# Patient Record
Sex: Female | Born: 1995 | Race: White | Hispanic: No | Marital: Single | Smoking: Current every day smoker
Health system: Southern US, Community
[De-identification: ages and names within clinical notes are randomized; demographics above are authoritative.]

## PROBLEM LIST (undated history)

## (undated) ENCOUNTER — Inpatient Hospital Stay: Payer: Self-pay

## (undated) ENCOUNTER — Inpatient Hospital Stay (HOSPITAL_COMMUNITY): Payer: Self-pay

## (undated) DIAGNOSIS — F419 Anxiety disorder, unspecified: Secondary | ICD-10-CM

## (undated) DIAGNOSIS — F32A Depression, unspecified: Secondary | ICD-10-CM

## (undated) DIAGNOSIS — K219 Gastro-esophageal reflux disease without esophagitis: Secondary | ICD-10-CM

## (undated) DIAGNOSIS — B1920 Unspecified viral hepatitis C without hepatic coma: Secondary | ICD-10-CM

## (undated) DIAGNOSIS — J45909 Unspecified asthma, uncomplicated: Secondary | ICD-10-CM

## (undated) DIAGNOSIS — F329 Major depressive disorder, single episode, unspecified: Secondary | ICD-10-CM

## (undated) HISTORY — PX: TONSILLECTOMY: SUR1361

## (undated) HISTORY — PX: TONSILECTOMY, ADENOIDECTOMY, BILATERAL MYRINGOTOMY AND TUBES: SHX2538

---
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
## 2016-04-06 DIAGNOSIS — F329 Major depressive disorder, single episode, unspecified: Secondary | ICD-10-CM | POA: Insufficient documentation

## 2016-04-06 DIAGNOSIS — N760 Acute vaginitis: Secondary | ICD-10-CM | POA: Insufficient documentation

## 2016-04-06 DIAGNOSIS — Z91040 Latex allergy status: Secondary | ICD-10-CM | POA: Insufficient documentation

## 2016-04-06 DIAGNOSIS — F17210 Nicotine dependence, cigarettes, uncomplicated: Secondary | ICD-10-CM | POA: Diagnosis not present

## 2016-04-06 DIAGNOSIS — R102 Pelvic and perineal pain: Secondary | ICD-10-CM | POA: Diagnosis present

## 2016-04-07 ENCOUNTER — Emergency Department
Admission: EM | Admit: 2016-04-07 | Discharge: 2016-04-07 | Disposition: A | Discharge status: Home / Self Care | Payer: Medicaid Other | Attending: Emergency Medicine | Admitting: Emergency Medicine

## 2016-04-07 ENCOUNTER — Emergency Department: Payer: Medicaid Other

## 2016-04-07 ENCOUNTER — Encounter: Payer: Self-pay | Admitting: Emergency Medicine

## 2016-04-07 DIAGNOSIS — N760 Acute vaginitis: Secondary | ICD-10-CM

## 2016-04-07 DIAGNOSIS — B9689 Other specified bacterial agents as the cause of diseases classified elsewhere: Secondary | ICD-10-CM

## 2016-04-07 DIAGNOSIS — R102 Pelvic and perineal pain: Secondary | ICD-10-CM

## 2016-04-07 HISTORY — DX: Anxiety disorder, unspecified: F41.9

## 2016-04-07 HISTORY — DX: Major depressive disorder, single episode, unspecified: F32.9

## 2016-04-07 HISTORY — DX: Depression, unspecified: F32.A

## 2016-04-07 LAB — CHLAMYDIA/NGC RT PCR (ARMC ONLY)
Chlamydia Tr: NOT DETECTED
N gonorrhoeae: NOT DETECTED

## 2016-04-07 LAB — URINALYSIS COMPLETE WITH MICROSCOPIC (ARMC ONLY)
Bacteria, UA: NONE SEEN
Bilirubin Urine: NEGATIVE
Glucose, UA: NEGATIVE mg/dL
Ketones, ur: NEGATIVE mg/dL
Leukocytes, UA: NEGATIVE
Nitrite: NEGATIVE
Protein, ur: NEGATIVE mg/dL
Specific Gravity, Urine: 1.02 (ref 1.005–1.030)
pH: 6 (ref 5.0–8.0)

## 2016-04-07 LAB — PREGNANCY, URINE: PREG TEST UR: NEGATIVE

## 2016-04-07 LAB — WET PREP, GENITAL
Sperm: NONE SEEN
Trich, Wet Prep: NONE SEEN
Yeast Wet Prep HPF POC: NONE SEEN

## 2016-04-07 MED ORDER — METRONIDAZOLE 500 MG PO TABS: 500.0000 mg | ORAL_TABLET | Freq: Two times a day (BID) | ORAL | Status: DC

## 2016-04-07 NOTE — Discharge Instructions (Signed)
Your exam was reassuring today.  Your symptoms may be the result of a common, non-sexually-transmitted infection called bacterial vaginosis.  We recommend she take the full course of prescribed medication and take over-the-counter Tylenol and ibuprofen according to the label instructions.  Please follow up either with your regular doctor or with the GYN specialist listed in this paperwork.   Bacterial Vaginosis Bacterial vaginosis is a vaginal infection that occurs when the normal balance of bacteria in the vagina is disrupted. It results from an overgrowth of certain bacteria. This is the most common vaginal infection in women of childbearing age. Treatment is important to prevent complications, especially in pregnant women, as it can cause a premature delivery. CAUSES  Bacterial vaginosis is caused by an increase in harmful bacteria that are normally present in smaller amounts in the vagina. Several different kinds of bacteria can cause bacterial vaginosis. However, the reason that the condition develops is not fully understood. RISK FACTORS Certain activities or behaviors can put you at an increased risk of developing bacterial vaginosis, including:  Having a new sex partner or multiple sex partners.  Douching.  Using an intrauterine device (IUD) for contraception. Women do not get bacterial vaginosis from toilet seats, bedding, swimming pools, or contact with objects around them. SIGNS AND SYMPTOMS  Some women with bacterial vaginosis have no signs or symptoms. Common symptoms include:  Grey vaginal discharge.  A fishlike odor with discharge, especially after sexual intercourse.  Itching or burning of the vagina and vulva.  Burning or pain with urination. DIAGNOSIS  Your health care provider will take a medical history and examine the vagina for signs of bacterial vaginosis. A sample of vaginal fluid may be taken. Your health care provider will look at this sample under a microscope to  check for bacteria and abnormal cells. A vaginal pH test may also be done.  TREATMENT  Bacterial vaginosis may be treated with antibiotic medicines. These may be given in the form of a pill or a vaginal cream. A second round of antibiotics may be prescribed if the condition comes back after treatment. Because bacterial vaginosis increases your risk for sexually transmitted diseases, getting treated can help reduce your risk for chlamydia, gonorrhea, HIV, and herpes. HOME CARE INSTRUCTIONS   Only take over-the-counter or prescription medicines as directed by your health care provider.  If antibiotic medicine was prescribed, take it as directed. Make sure you finish it even if you start to feel better.  Tell all sexual partners that you have a vaginal infection. They should see their health care provider and be treated if they have problems, such as a mild rash or itching.  During treatment, it is important that you follow these instructions:  Avoid sexual activity or use condoms correctly.  Do not douche.  Avoid alcohol as directed by your health care provider.  Avoid breastfeeding as directed by your health care provider. SEEK MEDICAL CARE IF:   Your symptoms are not improving after 3 days of treatment.  You have increased discharge or pain.  You have a fever. MAKE SURE YOU:   Understand these instructions.  Will watch your condition.  Will get help right away if you are not doing well or get worse. FOR MORE INFORMATION  Centers for Disease Control and Prevention, Division of STD Prevention: SolutionApps.co.zawww.cdc.gov/std American Sexual Health Association (ASHA): www.ashastd.org    This information is not intended to replace advice given to you by your health care provider. Make sure you discuss any questions  you have with your health care provider.   Document Released: 10/01/2005 Document Revised: 10/22/2014 Document Reviewed: 05/13/2013 Elsevier Interactive Patient Education 2016  [employer]  Pelvic Pain, Female Female pelvic pain can be caused by many different things and start from a variety of places. Pelvic pain refers to pain that is located in the lower half of the abdomen and between your hips. The pain may occur over a short period of time (acute) or may be reoccurring (chronic). The cause of pelvic pain may be related to disorders affecting the female reproductive organs (gynecologic), but it may also be related to the bladder, kidney stones, an intestinal complication, or muscle or skeletal problems. Getting help right away for pelvic pain is important, especially if there has been severe, sharp, or a sudden onset of unusual pain. It is also important to get help right away because some types of pelvic pain can be life threatening.  CAUSES  Below are only some of the causes of pelvic pain. The causes of pelvic pain can be in one of several categories.   Gynecologic.  Pelvic inflammatory disease.  Sexually transmitted infection.  Ovarian cyst or a twisted ovarian ligament (ovarian torsion).  Uterine lining that grows outside the uterus (endometriosis).  Fibroids, cysts, or tumors.  Ovulation.  Pregnancy.  Pregnancy that occurs outside the uterus (ectopic pregnancy).  Miscarriage.  Labor.  Abruption of the placenta or ruptured uterus.  Infection.  Uterine infection (endometritis).  Bladder infection.  Diverticulitis.  Miscarriage related to a uterine infection (septic abortion).  Bladder.  Inflammation of the bladder (cystitis).  Kidney stone(s).  Gastrointestinal.  Constipation.  Diverticulitis.  Neurologic.  Trauma.  Feeling pelvic pain because of mental or emotional causes (psychosomatic).  Cancers of the bowel or pelvis. EVALUATION  Your caregiver will want to take a careful history of your concerns. This includes recent changes in your health, a careful gynecologic history of your periods (menses), and a sexual  history. Obtaining your family history and medical history is also important. Your caregiver may suggest a pelvic exam. A pelvic exam will help identify the location and severity of the pain. It also helps in the evaluation of which organ system may be involved. In order to identify the cause of the pelvic pain and be properly treated, your caregiver may order tests. These tests may include:   A pregnancy test.  Pelvic ultrasonography.  An X-ray exam of the abdomen.  A urinalysis or evaluation of vaginal discharge.  Blood tests. HOME CARE INSTRUCTIONS   Only take over-the-counter or prescription medicines for pain, discomfort, or fever as directed by your caregiver.   Rest as directed by your caregiver.   Eat a balanced diet.   Drink enough fluids to make your urine clear or pale yellow, or as directed.   Avoid sexual intercourse if it causes pain.   Apply warm or cold compresses to the lower abdomen depending on which one helps the pain.   Avoid stressful situations.   Keep a journal of your pelvic pain. Write down when it started, where the pain is located, and if there are things that seem to be associated with the pain, such as food or your menstrual cycle.  Follow up with your caregiver as directed.  SEEK MEDICAL CARE IF:  Your medicine does not help your pain.  You have abnormal vaginal discharge. SEEK IMMEDIATE MEDICAL CARE IF:   You have heavy bleeding from the vagina.   Your pelvic pain increases.  You feel light-headed or faint.   You have chills.   You have pain with urination or blood in your urine.   You have uncontrolled diarrhea or vomiting.   You have a fever or persistent symptoms for more than 3 days.  You have a fever and your symptoms suddenly get worse.   You are being physically or sexually abused.   This information is not intended to replace advice given to you by your health care provider. Make sure you discuss any  questions you have with your health care provider.   Document Released: 08/28/2004 Document Revised: 06/22/2015 Document Reviewed: 01/21/2012 Elsevier Interactive Patient Education [employer]

## 2016-04-07 NOTE — ED Provider Notes (Signed)
[facility]  ____________________________________________  Time seen: Approximately 2:32 AM  I have reviewed the triage vital signs and the nursing notes.   HISTORY  Chief Complaint Pelvic Pain    HPI Shirley Black is a 20 y.o. female with a past medical history that includes at least 2 prior episodes of PID and who has an IUD in place by her family doctor who presents for evaluation of gradual onset of centralized lower pelvic pain that is both sharp and aching in character, waxes and wanes from mild to moderate in intensity, is worse with exertion and movement, and is gotten worse over the last few days.  It was severe earlier today which prompted her to come to the emergency department tonight.  She denies fever/chills, chest pain, shortness of breath, nausea, vomiting, diarrhea, dysuria.  She is sexually active currently and does not use condoms.   Past Medical History  Diagnosis Date  . Anxiety   . Depression     Patient Active Problem List   Diagnosis Date Noted  . EAR PAIN, LEFT 01/11/2010  . MENORRHAGIA 01/11/2010  . 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 Tonsillectomy      Current Outpatient Rx  Name  Route  Sig  Dispense  Refill  . metroNIDAZOLE (FLAGYL) 500 MG tablet   Oral   Take 1 tablet (500 mg total) by mouth 2 (two) times daily.   14 tablet   0     Allergies Latex  History reviewed. No pertinent family history.  Social History Social History  Substance Use Topics  . Smoking status: Current Every Day Smoker -- 1.00 packs/day    Types: Cigarettes  . Smokeless tobacco: Never Used  . Alcohol Use: No    Review of Systems Constitutional: No fever/chills Eyes: No visual changes. ENT: No sore throat. Cardiovascular: Denies chest pain. Respiratory: Denies shortness of breath. Gastrointestinal: +pelvic pain.  No  nausea, no vomiting.  No diarrhea.  No constipation. Genitourinary: Negative for dysuria.   Musculoskeletal: Negative for back pain. Skin: Negative for rash. Neurological: Negative for headaches, focal weakness or numbness.  10-point ROS otherwise negative.  ____________________________________________   PHYSICAL EXAM:  VITAL SIGNS: ED Triage Vitals  Enc Vitals Group     BP 04/07/16 0001 139/81 mmHg     Pulse Rate 04/07/16 0001 104     Resp 04/07/16 0001 20     Temp 04/07/16 0001 98.3 F (36.8 C)     Temp Source 04/07/16 0001 Oral     SpO2 04/07/16 0001 98 %     Weight 04/07/16 0001 180 lb (81.647 kg)     Height 04/07/16 0001 5\' 5"  (1.651 m)     Head Cir --      Peak Flow --      Pain Score 04/07/16 0002 8     Pain Loc --      Pain Edu? --      Excl. in GC? --     Constitutional: Alert and oriented. Well appearing and in no acute distress. Eyes: Conjunctivae are normal. PERRL. EOMI. Head: Atraumatic. Nose: No congestion/rhinnorhea. Mouth/Throat: Mucous membranes are moist.  Oropharynx non-erythematous. Neck: No stridor.  No meningeal signs.   Cardiovascular: Normal rate, regular rhythm. Good peripheral circulation. Grossly normal heart sounds.   Respiratory: Normal respiratory effort.  No retractions. Lungs CTAB. Gastrointestinal: Soft and nontender. No distention.  Genitourinary:  No external lesions.  Thick yellowish-white discharge in vault.  IUD strings visible and appear appropriately placed.  Mild left adnexal tenderness on bimanual exam. Musculoskeletal: No lower extremity tenderness nor edema. No gross deformities of extremities. Neurologic:  Normal speech and language. No gross focal neurologic deficits are appreciated.  Skin:  Skin is warm, dry and intact. No rash noted. Psychiatric: Mood and affect are normal. Speech and behavior are normal.  ____________________________________________   LABS (all labs ordered are listed, but only abnormal results are  displayed)  Labs Reviewed  WET PREP, GENITAL - Abnormal; Notable for the following:    Clue Cells Wet Prep HPF POC PRESENT (*)    WBC, Wet Prep HPF POC MODERATE (*)    All other components within normal limits  URINALYSIS COMPLETEWITH MICROSCOPIC ([facility] ONLY) - Abnormal; Notable for the following:    Color, Urine YELLOW (*)    APPearance HAZY (*)    Hgb urine dipstick 1+ (*)    Squamous Epithelial / LPF 6-30 (*)    All other components within normal limits  CHLAMYDIA/NGC RT PCR ([facility] ONLY)  PREGNANCY, URINE  POC URINE PREG, ED   ____________________________________________  EKG  None ____________________________________________  RADIOLOGY   [place] Transvaginal Non-ob  04/07/2016  CLINICAL DATA:  20 year old female with intermittent pelvic pain. Evaluate for IUD positioning. EXAM: TRANSABDOMINAL AND TRANSVAGINAL ULTRASOUND OF PELVIS TECHNIQUE: Both transabdominal and transvaginal ultrasound examinations of the pelvis were performed. Transabdominal technique was performed for global imaging of the pelvis including uterus, ovaries, adnexal regions, and pelvic cul-de-sac. It was necessary to proceed with endovaginal exam following the transabdominal exam to visualize the endometrium and the ovaries. COMPARISON:  None FINDINGS: Uterus Measurements: 5.7 x 2.9 x 4.0 cm. No fibroids or other mass visualized. Endometrium Thickness: 4 mm. No focal abnormality visualized. An intrauterine device is noted which appears slightly lower than the uterine fundus . On the transvaginal images the arms of the arms of the IUD are not visualized in uterine fundus. Right ovary Measurements: 4.2 x 2.1 x 2.4 cm. There is a 1.8 x 1.5 x 1.8 cm hypoechoic lesion in the right ovary, likely a complex/hemorrhagic cyst versus a corpus luteum. Left ovary Measurements: 3.9 x 1.8 x 2.1 cm. Normal appearance/no adnexal mass. Other findings Small free fluid within the pelvis. IMPRESSION: Intrauterine device appears  slightly lower than uterine fundus. Clinical correlation is recommended. Probable right ovarian corpus luteum. Electronically Signed   By: Elgie CollardArash  Radparvar M.D.   On: 04/07/2016 05:20   [place] Pelvis Complete  04/07/2016  CLINICAL DATA:  20 year old female with intermittent pelvic pain. Evaluate for IUD positioning. EXAM: TRANSABDOMINAL AND TRANSVAGINAL ULTRASOUND OF PELVIS TECHNIQUE: Both transabdominal and transvaginal ultrasound examinations of the pelvis were performed. Transabdominal technique was performed for global imaging of the pelvis including uterus, ovaries, adnexal regions, and pelvic cul-de-sac. It was necessary to proceed with endovaginal exam following the transabdominal exam to visualize the endometrium and the ovaries. COMPARISON:  None FINDINGS: Uterus Measurements: 5.7 x 2.9 x 4.0 cm. No fibroids or other mass visualized. Endometrium Thickness: 4 mm. No focal abnormality visualized. An intrauterine device is noted which appears slightly lower than the uterine fundus . On the transvaginal images the arms of the arms of the IUD are not visualized in uterine fundus. Right ovary Measurements: 4.2 x 2.1 x 2.4 cm. There is a 1.8 x 1.5 x 1.8 cm hypoechoic lesion in the right ovary, likely a complex/hemorrhagic cyst versus a corpus luteum. Left ovary Measurements: 3.9  x 1.8 x 2.1 cm. Normal appearance/no adnexal mass. Other findings Small free fluid within the pelvis. IMPRESSION: Intrauterine device appears slightly lower than uterine fundus. Clinical correlation is recommended. Probable right ovarian corpus luteum. Electronically Signed   By: Elgie Collard M.D.   On: 04/07/2016 05:20    ____________________________________________   PROCEDURES  Procedure(s) performed:   Procedures   ____________________________________________   INITIAL IMPRESSION / ASSESSMENT AND PLAN / ED COURSE  Pertinent labs & imaging results that were available during my care of the patient were reviewed by me  and considered in my medical decision making (see chart for details).  Evaluation unremarkable except for Bacterial vaginosis.  The ultrasound question the position of the IUD, but on pelvic exam correlates clinically and her bili.  I am providing metronidazole and recommending close outpatient follow-up.  The patient has been sleeping comfortably since the ultrasound was obtained.  I gave my usual customary return precautions.   ____________________________________________  FINAL CLINICAL IMPRESSION(S) / ED DIAGNOSES  Final diagnoses:  Bacterial vaginosis  Pelvic pain in female     MEDICATIONS GIVEN DURING THIS VISIT:  Medications - No data to display   NEW OUTPATIENT MEDICATIONS STARTED DURING THIS VISIT:  Discharge Medication List as of 04/07/2016  5:32 AM    START taking these medications   Details  metroNIDAZOLE (FLAGYL) 500 MG tablet Take 1 tablet (500 mg total) by mouth 2 (two) times daily., Starting 04/07/2016, Until Discontinued, Print          Note:  This document was prepared using Dragon voice recognition software and may include unintentional dictation errors.   Loleta Rose, MD 04/07/16 [phone]

## 2016-04-07 NOTE — ED Notes (Signed)
Pt. States she has a three year IUD that will expire at the end of this year.  Pt. States she has had PID two times, last time was last year.  Pt. States intermittent pain for the past week, worse today.

## 2016-04-07 NOTE — ED Notes (Signed)
Pt presents to ED with sharp pelvic pain. Pt states she has a IUD in place that is getting ready to expire and has been experiencing intermittent pelvic pain for over a week. Pain worsened today and makes it uncomfortable for her to sit down. Denies vaginal discharge.

## 2016-09-17 ENCOUNTER — Ambulatory Visit: Payer: Medicaid Other

## 2016-10-04 ENCOUNTER — Encounter: Payer: Self-pay | Admitting: *Deleted

## 2016-10-04 ENCOUNTER — Ambulatory Visit
Admission: RE | Admit: 2016-10-04 | Discharge: 2016-10-04 | Disposition: A | Discharge status: Home / Self Care | Payer: Medicaid Other | Source: Ambulatory Visit | Attending: Maternal and Fetal Medicine | Admitting: Maternal and Fetal Medicine

## 2016-10-04 VITALS — BP 122/65 | HR 90 | Temp 98.1°F | Wt 240.0 lb

## 2016-10-04 DIAGNOSIS — J4530 Mild persistent asthma, uncomplicated: Secondary | ICD-10-CM

## 2016-10-04 DIAGNOSIS — B1920 Unspecified viral hepatitis C without hepatic coma: Secondary | ICD-10-CM | POA: Diagnosis not present

## 2016-10-04 HISTORY — DX: Unspecified viral hepatitis C without hepatic coma: B19.20

## 2016-10-04 HISTORY — DX: Unspecified asthma, uncomplicated: J45.909

## 2016-10-04 NOTE — Consult Note (Signed)
[facility] Consultation   Chief Complaint: New diagnosis of Hepatitis C   HPI: Shirley Black is a 20 y.o. G1P0 at 2929w2d by  who presents in consultation from  for a new diagnosis of Hepatitis C. The patient had testing on initial prenatal labs. She had a +ab test and a confirmatory PCR was positive with a viral load of 778,000 IU/ml and genotype 3. She denies injection or snorting drug use but reports that she has had three home tattoo sessions, one in 2015, one early 2017, and one mid-2017. She thinks these were not clean needles.   Past Medical History: Patient  has a past medical history of Anxiety; Asthma; Depression; and Hepatitis C.  She does use her inhaler 3 times per week and had significant asthma as a child requiring nebulizer therapy. She denies hospitalization history.  Past Surgical History: She  has a past surgical history that includes Tonsillectomy and Tonsilectomy, adenoidectomy, bilateral myringotomy and tubes.  Obstetric History:  OB History    Gravida Para Term Preterm AB Living   1         0   SAB TAB Ectopic Multiple Live Births                   Medications: She takes albuterol and lexapro  Allergies: Patient is allergic to latex.  Social History: Patient  reports that she has been smoking Cigarettes.  She has been smoking about 1.00 pack per day. She has never used smokeless tobacco. She reports that she does not drink alcohol or use drugs. She reports recently decreasing to 4-5 cigs/day  Family History: family history is not on file.  Review of Systems A full 12 point review of systems was negative or as noted in the History of Present Illness.  Physical Exam: BP 122/65   Pulse 90   Temp 98.1 F (36.7 C)   Wt 240 lb (108.9 kg)   BMI 39.94 kg/m  Advice only  Asessement: 1. Hepatitis C in pregnancy  We discussed the risk of Hepatitis C in general including liver failure (cirrhosis) and cancer. We discussed the blood-borne  nature of the disease and the need for avoidance of sharing needles. We also discussed that the disease is now treatable with medications that can essentially lead to clinical cure. I advised that we do not recommend the use of these medications during pregnancy but that it is important that she establish care with a hepatologist who can assess her candidacy for medical therapy and develop a plan for treatment following this pregnancy and ideally before she considers another pregnancy. I recommend checking a PT/PTT to evaluate liver function now and would re-check her AST/ALT each trimester.  We also discussed the risk of fetal transmission of approximately 5%. We discussed that this is likely a function of viral load but that the precise risks related to her viral load are unknown. In general pregnancy is not managed differently due to the diagnosis of HCV. We recommend avoiding fetal scalp electrodes if possible and minimizing the duration of ruptured membranes by avoiding early amniotomy. Cesarean delivery is not indicated. Breast feeding is acceptable but she should avoid cracked and bleeding nipples.  We discussed the importance of hepatology follow-up for the infant and that the infant should be followed until age 19 months to rule out congenital infection.  2. Asthma, given the frequent use of her rescue inhaler, I recommend adding a daily steroid inhaler to decrease the  risk of asthma flares. We also recommend checking peak flows with a peak flow meter if she doesn't already do this. She may need a peak flow meter prescription as well.   Total time spent with the patient was 40 minutes with greater than 50% spent in counseling and coordination of care. We appreciate this interesting consult and will be happy to be involved in the ongoing care of Shirley Black in anyway her obstetricians desire.  Artemio AlyBrenna Osamu Olguin, MD [facility]aternal[facility]Fetal Medicine [facility]

## 2016-11-08 ENCOUNTER — Other Ambulatory Visit: Payer: Self-pay | Admitting: Obstetrics and Gynecology

## 2016-11-08 DIAGNOSIS — Z3402 Encounter for supervision of normal first pregnancy, second trimester: Secondary | ICD-10-CM

## 2016-11-08 DIAGNOSIS — Z3689 Encounter for other specified antenatal screening: Secondary | ICD-10-CM

## 2016-11-09 ENCOUNTER — Ambulatory Visit (HOSPITAL_COMMUNITY): Payer: Medicaid Other

## 2017-01-13 ENCOUNTER — Encounter: Payer: Self-pay | Admitting: Emergency Medicine

## 2017-01-13 ENCOUNTER — Emergency Department
Admission: EM | Admit: 2017-01-13 | Discharge: 2017-01-13 | Disposition: A | Discharge status: Home / Self Care | Payer: Medicaid Other | Attending: Emergency Medicine | Admitting: Emergency Medicine

## 2017-01-13 ENCOUNTER — Emergency Department: Payer: Medicaid Other

## 2017-01-13 DIAGNOSIS — O99333 Smoking (tobacco) complicating pregnancy, third trimester: Secondary | ICD-10-CM | POA: Diagnosis not present

## 2017-01-13 DIAGNOSIS — Z3A29 29 weeks gestation of pregnancy: Secondary | ICD-10-CM | POA: Insufficient documentation

## 2017-01-13 DIAGNOSIS — J4521 Mild intermittent asthma with (acute) exacerbation: Secondary | ICD-10-CM

## 2017-01-13 DIAGNOSIS — F17210 Nicotine dependence, cigarettes, uncomplicated: Secondary | ICD-10-CM | POA: Diagnosis not present

## 2017-01-13 DIAGNOSIS — O99513 Diseases of the respiratory system complicating pregnancy, third trimester: Secondary | ICD-10-CM | POA: Insufficient documentation

## 2017-01-13 MED ORDER — IPRATROPIUM-ALBUTEROL 0.5-2.5 (3) MG/3ML IN SOLN
3.0000 mL | Freq: Once | RESPIRATORY_TRACT | Status: AC
  Administered 2017-01-13: 3 mL via RESPIRATORY_TRACT
  Filled 2017-01-13: qty 3

## 2017-01-13 MED ORDER — SODIUM CHLORIDE 0.9 % IV BOLUS (SEPSIS)
1000.0000 mL | Freq: Once | INTRAVENOUS | Status: AC
  Administered 2017-01-13: 1000 mL via INTRAVENOUS

## 2017-01-13 MED ORDER — ONDANSETRON HCL 4 MG/2ML IJ SOLN
4.0000 mg | Freq: Once | INTRAMUSCULAR | Status: AC
  Administered 2017-01-13: 4 mg via INTRAVENOUS
  Filled 2017-01-13: qty 2

## 2017-01-13 MED ORDER — ALBUTEROL SULFATE HFA 108 (90 BASE) MCG/ACT IN AERS: 2.0000 | INHALATION_SPRAY | Freq: Four times a day (QID) | RESPIRATORY_TRACT | 2 refills | Status: DC | PRN

## 2017-01-13 MED ORDER — METHYLPREDNISOLONE SODIUM SUCC 125 MG IJ SOLR
125.0000 mg | Freq: Once | INTRAMUSCULAR | Status: AC
  Administered 2017-01-13: 125 mg via INTRAVENOUS
  Filled 2017-01-13: qty 2

## 2017-01-13 MED ORDER — PREDNISONE 20 MG PO TABS: 60.0000 mg | ORAL_TABLET | Freq: Every day | ORAL | 0 refills | Status: AC

## 2017-01-13 NOTE — ED Provider Notes (Signed)
[facility] Emergency Department Provider Note  ____________________________________________  Time seen: Approximately 8:03 PM  I have reviewed the triage vital signs and the nursing notes.   HISTORY  Chief Complaint Cough   HPI Shirley Black is a 21 y.o. female with h/o asthma, smoking, and G1P0 currently at [redacted] weeks GA who presents for evaluation of SOB, wheezing, and cough. Patient reports that she has had cough, SOB, and wheezing for 3 weeks. Has finished a course of tamiflu, one of amoxicillin, and one of augmentin, and one course of steroids. She has had no fever or body aches however continues to have a severe cough, post-tussive emesis, and wheezing. Has been using her inhalers at home with minimal relief. No complications during pregnancy, no contractions, no vaginal bleeding, no water loss, good fetal movement. No diarrhea or CP.   Past Medical History:  Diagnosis Date  . Anxiety   . Asthma   . Depression   . Hepatitis C     Patient Active Problem List   Diagnosis Date Noted  . EAR PAIN, LEFT 01/11/2010  . MENORRHAGIA 01/11/2010  . 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 TONSILECTOMY, ADENOIDECTOMY, BILATERAL MYRINGOTOMY AND TUBES    . TONSILLECTOMY      Prior to Admission medications   Medication Sig Start Date End Date Taking? Authorizing Provider  albuterol (PROVENTIL HFA;VENTOLIN HFA) 108 (90 Base) MCG/ACT inhaler Inhale 2 puffs into the lungs every 6 (six) hours as needed for wheezing or shortness of breath. 01/13/17   Nita Sickle, MD  predniSONE (DELTASONE) 20 MG tablet Take 3 tablets (60 mg total) by mouth daily. 01/13/17 01/17/17  Nita Sickle, MD  Prenatal Vit-Fe Fumarate-FA (MULTIVITAMIN-PRENATAL) 27-0.8 MG TABS tablet Take 1 tablet by mouth daily at 12 noon.    Historical Provider, MD    Allergies Latex  No family history on file.  Social  History Social History  Substance Use Topics  . Smoking status: Current Every Day Smoker    Packs/day: 1.00    Types: Cigarettes  . Smokeless tobacco: Never Used  . Alcohol use No    Review of Systems  Constitutional: Negative for fever. Eyes: Negative for visual changes. ENT: Negative for sore throat. Neck: No neck pain  Cardiovascular: Negative for chest pain. Respiratory: + shortness of breath, cough, wheezing Gastrointestinal: Negative for abdominal pain, vomiting or diarrhea. Genitourinary: Negative for dysuria. Musculoskeletal: Negative for back pain. Skin: Negative for rash. Neurological: Negative for headaches, weakness or numbness. Psych: No SI or HI  ____________________________________________   PHYSICAL EXAM:  VITAL SIGNS: ED Triage Vitals  Enc Vitals Group     BP 01/13/17 1802 (!) 144/87     Pulse Rate 01/13/17 1802 (!) 124     Resp 01/13/17 1802 20     Temp 01/13/17 1802 98.2 F (36.8 C)     Temp Source 01/13/17 1802 Oral     SpO2 01/13/17 1802 98 %     Weight --      Height 01/13/17 1800  (1.651 m)     Head Circumference --      Peak Flow --      Pain Score 01/13/17 1800 5     Pain Loc --      Pain Edu? --      Excl. in GC? --     Constitutional: Alert and oriented. Well appearing and in no apparent distress. HEENT:  Head: Normocephalic and atraumatic.         Eyes: Conjunctivae are normal. Sclera is non-icteric. EOMI. PERRL      Mouth/Throat: Mucous membranes are moist.       Neck: Supple with no signs of meningismus. Cardiovascular: Tachycardic with regular rhythm. No murmurs, gallops, or rubs. 2+ symmetrical distal pulses are present in all extremities. No JVD. Respiratory: Normal respiratory effort. slightly decreased breath sounds and faint expiratory wheezes Gastrointestinal: Soft, non tender, and non distended with positive bowel sounds. No rebound or guarding. Genitourinary: No CVA tenderness. Musculoskeletal: Nontender with  normal range of motion in all extremities. No edema, cyanosis, or erythema of extremities. Neurologic: Normal speech and language. Face is symmetric. Moving all extremities. No gross focal neurologic deficits are appreciated. Skin: Skin is warm, dry and intact. No rash noted. Psychiatric: Mood and affect are normal. Speech and behavior are normal.  ____________________________________________   LABS (all labs ordered are listed, but only abnormal results are displayed)  Labs Reviewed - No data to display ____________________________________________  EKG  ED ECG REPORT I, Nita Sickle, the attending physician, personally viewed and interpreted this ECG.  Sinus tachycardia, rate of 1:15, normal intervals, normal axis, no ST elevations or depressions, flattening T waves in lead 3. No prior for comparison ____________________________________________  RADIOLOGY  CXR: Negative  ____________________________________________   PROCEDURES  Procedure(s) performed:yes Procedures   BEDSIDE US showing IUP with FHR 142  Critical Care performed:  None ____________________________________________   INITIAL IMPRESSION / ASSESSMENT AND PLAN / ED COURSE   21 y.o. female with h/o asthma, smoking, and G1P0 currently at [redacted] weeks GA who presents for evaluation of SOB, wheezing, cough, and post-tussive emesis. Patient has normal work of breathing, normal sats, she has slightly decreased air movement with faint expiratory wheezes. She is tachycardic and afebrile and has had several episodes of posttussive emesis. We'll give IV fluids, duoneb, zofran, Solu-Medrol, we'll get a chest x-ray to rule out pneumonia.  Clinical Course as of Jan 13 2153  Wynelle Link Jan 13, 2017  2151 Patient's cough has markedly improved after 2 DuoNeb labs. Wheezing has resolved and patient is moving great air. Chest x-ray with no evidence of pneumonia. Good fetal activity per patient. Heart rate has gone down with IV fluids.  She is now vomiting and tolerating by mouth. We'll discharge home on prednisone, albuterol, and close follow-up with primary care doctor.  [CV]    Clinical Course User Index [CV] Nita Sickle, MD    Pertinent labs & imaging results that were available during my care of the patient were reviewed by me and considered in my medical decision making (see chart for details).    ____________________________________________   FINAL CLINICAL IMPRESSION(S) / ED DIAGNOSES  Final diagnoses:  Mild intermittent asthma with exacerbation      NEW MEDICATIONS STARTED DURING THIS VISIT:  New Prescriptions   ALBUTEROL (PROVENTIL HFA;VENTOLIN HFA) 108 (90 BASE) MCG/ACT INHALER    Inhale 2 puffs into the lungs every 6 (six) hours as needed for wheezing or shortness of breath.   PREDNISONE (DELTASONE) 20 MG TABLET    Take 3 tablets (60 mg total) by mouth daily.     Note:  This document was prepared using Dragon voice recognition software and may include unintentional dictation errors.    Nita Sickle, MD 01/13/17 2154

## 2017-01-13 NOTE — ED Triage Notes (Signed)
Pt reports she was dx with flu, bronchitis and strep in the past 3 weeks. Has taken prescribed medications with no relief. Pt reports pain to upper body from amount of coughing. Pt is [redacted] weeks pregnant.

## 2017-02-14 ENCOUNTER — Observation Stay
Admission: EM | Admit: 2017-02-14 | Discharge: 2017-02-14 | Disposition: A | Discharge status: Home / Self Care | Payer: Medicaid Other | Attending: Obstetrics and Gynecology | Admitting: Obstetrics and Gynecology

## 2017-02-14 DIAGNOSIS — O98813 Other maternal infectious and parasitic diseases complicating pregnancy, third trimester: Secondary | ICD-10-CM | POA: Insufficient documentation

## 2017-02-14 DIAGNOSIS — Z3A33 33 weeks gestation of pregnancy: Secondary | ICD-10-CM | POA: Diagnosis not present

## 2017-02-14 DIAGNOSIS — O23593 Infection of other part of genital tract in pregnancy, third trimester: Secondary | ICD-10-CM

## 2017-02-14 DIAGNOSIS — O99333 Smoking (tobacco) complicating pregnancy, third trimester: Secondary | ICD-10-CM | POA: Insufficient documentation

## 2017-02-14 DIAGNOSIS — R102 Pelvic and perineal pain unspecified side: Secondary | ICD-10-CM | POA: Diagnosis present

## 2017-02-14 DIAGNOSIS — N760 Acute vaginitis: Secondary | ICD-10-CM

## 2017-02-14 DIAGNOSIS — F17210 Nicotine dependence, cigarettes, uncomplicated: Secondary | ICD-10-CM | POA: Diagnosis not present

## 2017-02-14 DIAGNOSIS — Z79899 Other long term (current) drug therapy: Secondary | ICD-10-CM | POA: Insufficient documentation

## 2017-02-14 DIAGNOSIS — O368130 Decreased fetal movements, third trimester, not applicable or unspecified: Principal | ICD-10-CM | POA: Insufficient documentation

## 2017-02-14 DIAGNOSIS — Z7951 Long term (current) use of inhaled steroids: Secondary | ICD-10-CM | POA: Diagnosis not present

## 2017-02-14 DIAGNOSIS — O26893 Other specified pregnancy related conditions, third trimester: Secondary | ICD-10-CM | POA: Diagnosis present

## 2017-02-14 LAB — WET PREP, GENITAL
CLUE CELLS WET PREP: NONE SEEN
Sperm: NONE SEEN
TRICH WET PREP: NONE SEEN

## 2017-02-14 LAB — URINALYSIS, ROUTINE W REFLEX MICROSCOPIC
Bilirubin Urine: NEGATIVE
GLUCOSE, UA: NEGATIVE mg/dL
Hgb urine dipstick: NEGATIVE
Ketones, ur: 5 mg/dL — AB
LEUKOCYTES UA: NEGATIVE
NITRITE: NEGATIVE
PROTEIN: NEGATIVE mg/dL
Specific Gravity, Urine: 1.017 (ref 1.005–1.030)
pH: 6 (ref 5.0–8.0)

## 2017-02-14 LAB — CHLAMYDIA/NGC RT PCR (ARMC ONLY)
Chlamydia Tr: NOT DETECTED
N gonorrhoeae: NOT DETECTED

## 2017-02-14 LAB — FETAL FIBRONECTIN: Fetal Fibronectin: NEGATIVE

## 2017-02-14 MED ORDER — CALCIUM CARBONATE ANTACID 500 MG PO CHEW: 2.0000 | CHEWABLE_TABLET | ORAL | Status: DC | PRN

## 2017-02-14 MED ORDER — FLUCONAZOLE 50 MG PO TABS
150.0000 mg | ORAL_TABLET | Freq: Every day | ORAL | Status: DC
  Administered 2017-02-14: 150 mg via ORAL
  Filled 2017-02-14: qty 3

## 2017-02-14 MED ORDER — PRENATAL MULTIVITAMIN CH: 1.0000 | ORAL_TABLET | Freq: Every day | ORAL | Status: DC

## 2017-02-14 MED ORDER — ZOLPIDEM TARTRATE 5 MG PO TABS: 5.0000 mg | ORAL_TABLET | Freq: Every evening | ORAL | Status: DC | PRN

## 2017-02-14 MED ORDER — ACETAMINOPHEN 325 MG PO TABS: 650.0000 mg | ORAL_TABLET | ORAL | Status: DC | PRN

## 2017-02-14 MED ORDER — DOCUSATE SODIUM 100 MG PO CAPS: 100.0000 mg | ORAL_CAPSULE | Freq: Every day | ORAL | Status: DC

## 2017-02-14 NOTE — OB Triage Provider Note (Signed)
TRIAGE NOTE to rule out Preterm Labor   History of Present Illness: Shirley Black is a 21 y.o. G1P0 at [redacted]w[redacted]d presenting to triage for preterm pain.  Pelvic pressure and pain today starting at 0630 this morning. Decreased fetal movement. No LOF, no VB. No recent intercourse.   Patient Active Problem List   Diagnosis Date Noted  . EAR PAIN, LEFT 01/11/2010  . MENORRHAGIA 01/11/2010  . ACNE VULGARIS, CYSTIC, PUSTULAR 01/11/2010    Past Medical History:  Diagnosis Date  . Anxiety   . Asthma   . Depression   . Hepatitis C     Past Surgical History:  Procedure Laterality Date  . TONSILECTOMY, ADENOIDECTOMY, BILATERAL MYRINGOTOMY AND TUBES    . TONSILLECTOMY      OB History  Gravida Para Term Preterm AB Living  1         0  SAB TAB Ectopic Multiple Live Births               # Outcome Date GA Lbr Len/2nd Weight Sex Delivery Anes PTL Lv  1 Current               Social History   Social History  . Marital status: Single    Spouse name: N/A  . Number of children: N/A  . Years of education: N/A   Social History Main Topics  . Smoking status: Current Every Day Smoker    Packs/day: 0.50    Types: Cigarettes  . Smokeless tobacco: Never Used  . Alcohol use No  . Drug use: No  . Sexual activity: Yes    Birth control/ protection: IUD   Other Topics Concern  . None   Social History Narrative  . None    History reviewed. No pertinent family history.  Allergies  Allergen Reactions  . Latex Hives  . Adhesive [Tape] Rash    Prescriptions Prior to Admission  Medication Sig Dispense Refill Last Dose  . albuterol (PROVENTIL HFA;VENTOLIN HFA) 108 (90 Base) MCG/ACT inhaler Inhale 2 puffs into the lungs every 6 (six) hours as needed for wheezing or shortness of breath. 1 Inhaler 2 Past Week at Unknown time  . Prenatal Vit-Fe Fumarate-FA (MULTIVITAMIN-PRENATAL) 27-0.8 MG TABS tablet Take 1 tablet by mouth daily at 12 noon.   Past Month at Unknown time     Review of Systems - See HPI for OB specific ROS.   Vitals:  BP 122/73 (BP Location: Right Arm)   Pulse (!) 109   Temp 98.5 F (36.9 C) (Oral)   Resp 18   Ht 5\' 5"  (1.651 m)   Wt 240 lb (108.9 kg)   BMI 39.94 kg/m  Physical Examination: CONSTITUTIONAL: Well-developed, well-nourished female in no acute distress.  HENT:  Normocephalic, atraumatic EYES: Conjunctivae and EOM are normal. No scleral icterus.  NECK: Normal range of motion, supple, SKIN: Skin is warm and dry. No rash noted. Not diaphoretic. No erythema. No pallor. NEUROLGIC: Alert and oriented to person, place, and time. No gross cranial nerve deficit noted. PSYCHIATRIC: Normal mood and affect. Normal behavior. Normal judgment and thought content. CARDIOVASCULAR: Normal heart rate noted, regular rhythm RESPIRATORY: Effort and breath sounds normal, no problems with respiration noted ABDOMEN: Soft, nontender, nondistended, gravid.  Cervix: closed/thick and high Membranes:intact Fetal Monitoring:Baseline: 125 bpm, Variability: Good {> 6 bpm), Accelerations: Non-reactive but appropriate for gestational age and Decelerations: Absent Tocometer: Flat  Labs:  Results for orders placed or performed during the hospital encounter of 02/14/17 (from  the past 24 hour(s))  Wet prep, genital   Collection Time: 02/14/17  1:27 PM  Result Value Ref Range   Yeast Wet Prep HPF POC PRESENT (A) NONE SEEN   Trich, Wet Prep NONE SEEN NONE SEEN   Clue Cells Wet Prep HPF POC NONE SEEN NONE SEEN   WBC, Wet Prep HPF POC FEW (A) NONE SEEN   Sperm NONE SEEN   Urinalysis, Routine w reflex microscopic   Collection Time: 02/14/17  1:27 PM  Result Value Ref Range   Color, Urine YELLOW (A) YELLOW   APPearance HAZY (A) CLEAR   Specific Gravity, Urine 1.017 1.005 - 1.030   pH 6.0 5.0 - 8.0   Glucose, UA NEGATIVE NEGATIVE mg/dL   Hgb urine dipstick NEGATIVE NEGATIVE   Bilirubin Urine NEGATIVE NEGATIVE   Ketones, ur 5 (A) NEGATIVE mg/dL    Protein, ur NEGATIVE NEGATIVE mg/dL   Nitrite NEGATIVE NEGATIVE   Leukocytes, UA NEGATIVE NEGATIVE    Imaging Studies: No results found.   Assessment and Plan: Patient Active Problem List   Diagnosis Date Noted  . EAR PAIN, LEFT 01/11/2010  . MENORRHAGIA 01/11/2010  . ACNE VULGARIS, CYSTIC, PUSTULAR 01/11/2010    1. Yeast noted on wet mount. Diflucan given. Pain now resolved. Precautions given. 2.   Cline CoolsBethany E Shawnte Demarest, MD, MPH

## 2017-02-14 NOTE — OB Triage Note (Signed)
Pt G1P0 1841w1d complains of decreased fetal movement and abdominal pain/back pain. Pt states she had not felt baby move since 0600 this morning, but felt movement at 1030. Pt states back pain is in lower back and is constant pain that she rates 7/10 since 0600 this morning. Pt states abdominal pain comes and goes near her bellybutton. Pt rates pain 7/10 and states that has been happening since 0600. VSS. Monitors applied and assessing.

## 2017-02-14 NOTE — Final Progress Note (Signed)
Pain resolved, yeast infection treated. See Triage note.

## 2017-02-14 NOTE — Discharge Summary (Signed)
Final progress note completed for this triage patient. No discharge summary required.

## 2017-03-25 ENCOUNTER — Inpatient Hospital Stay
Admission: EM | Admit: 2017-03-25 | Discharge: 2017-03-25 | Disposition: A | Discharge status: Home / Self Care | Payer: Medicaid Other | Attending: Obstetrics and Gynecology | Admitting: Obstetrics and Gynecology

## 2017-03-25 ENCOUNTER — Encounter: Payer: Self-pay | Admitting: *Deleted

## 2017-03-25 ENCOUNTER — Other Ambulatory Visit: Payer: Self-pay | Admitting: Obstetrics and Gynecology

## 2017-03-25 DIAGNOSIS — O99333 Smoking (tobacco) complicating pregnancy, third trimester: Secondary | ICD-10-CM | POA: Diagnosis not present

## 2017-03-25 DIAGNOSIS — F17210 Nicotine dependence, cigarettes, uncomplicated: Secondary | ICD-10-CM | POA: Insufficient documentation

## 2017-03-25 DIAGNOSIS — Z3A38 38 weeks gestation of pregnancy: Secondary | ICD-10-CM | POA: Diagnosis not present

## 2017-03-25 DIAGNOSIS — O471 False labor at or after 37 completed weeks of gestation: Secondary | ICD-10-CM | POA: Diagnosis not present

## 2017-03-25 DIAGNOSIS — Z91040 Latex allergy status: Secondary | ICD-10-CM | POA: Diagnosis not present

## 2017-03-25 DIAGNOSIS — R1030 Lower abdominal pain, unspecified: Secondary | ICD-10-CM | POA: Diagnosis present

## 2017-03-25 DIAGNOSIS — O4700 False labor before 37 completed weeks of gestation, unspecified trimester: Secondary | ICD-10-CM

## 2017-03-25 NOTE — Progress Notes (Signed)
Shirley Black is a 21 y.o. female. She is at 3354w5d gestation. No LMP recorded. Patient is pregnant. Estimated Date of Delivery: 04/03/17 bu US   [facility]   Chief complaint:Here for lower abd cramping today .  Location:lower abd cramping Onset/timing: Last few hours  Duration:lasting for several hours  Quality: slight cramping Severity:mild  Aggravating or alleviating conditions:none Associated signs/symptoms:none Context:none  S: Resting comfortably. no CTX, no VB.no LOF,  Active fetal movement. +  Maternal Medical History:   Past Medical History:  Diagnosis Date  . Anxiety   . Asthma   . Depression   . Hepatitis C     Past Surgical History:  Procedure Laterality Date  . TONSILECTOMY, ADENOIDECTOMY, BILATERAL MYRINGOTOMY AND TUBES    . TONSILLECTOMY      Allergies  Allergen Reactions  . Latex Hives  . Adhesive [Tape] Rash    Prior to Admission medications   Medication Sig Start Date End Date Taking? Authorizing Provider  albuterol (PROVENTIL HFA;VENTOLIN HFA) 108 (90 Base) MCG/ACT inhaler Inhale 2 puffs into the lungs every 6 (six) hours as needed for wheezing or shortness of breath. 01/13/17   Nita SickleVeronese, [place], MD  Prenatal Vit-Fe Fumarate-FA (MULTIVITAMIN-PRENATAL) 27-0.8 MG TABS tablet Take 1 tablet by mouth daily at 12 noon.    [provider]     Social History: She  reports that she has been smoking Cigarettes.  She has been smoking about 0.50 packs per day. She has never used smokeless tobacco. She reports that she does not drink alcohol or use drugs.  Family History: family history is not on file.  no history of gyn cancers  Review of Systems: A full review of systems was performed and negative except as noted in the HPI.     O: See BP in nursing notes Constitutional: NAD, AAOx3  HE/ENT: extraocular movements grossly intact, moist mucous membranes CV: RRR PULM: nl respiratory effort, CTABL    Abd:  gravid, non-tender, non-distended, soft      Ext: Non-tender, Nonedmeatous   Psych: mood appropriate, speech normal Pelvic:closed and long  NST: Reactive  Baseline: 140 Variability: moderate Accelerations present x >2 Decelerations absent Time 20mins    A/P: 21 y.o. 654w5d here for antenatal surveillance for Th PTL at 38 5/7 weeks.   Labor: not present.   Fetal Wellbeing: Reassuring Cat 1 tracing.  Reactive NST   D/c home stable, precautions reviewed, follow-up as scheduled.   ----- Ranae Plumberhelsea Ward, MD Attending Obstetrician and Gynecologist [facility], Department of OB/GYN [facility]

## 2017-03-25 NOTE — OB Triage Note (Signed)
Recvd to OBS 3 with c/o leaking fluid.  Changed to gown and to bed.  EFM applied.  Oriented to room and questions answered.  Plan of care discussed.  Verbalized understanding and agrees with plan.

## 2017-03-25 NOTE — Discharge Summary (Signed)
Obstetric Discharge Summary   Patient ID: Shirley Black MRN: [id number] DOB/AGE: 20/11/1995 20 y.o.   Date of Admission: 03/25/2017  Date of Discharge: 03/25/17  Admitting Diagnosis: Observation at 9368w5d  Secondary Diagnosis: abd cramping  Mode of Delivery: N/A     Discharge Diagnosis: Term pregnancy with abd cramping   Intrapartum Procedures:N/A   Post partum procedures: N/A  Complications:Labor is ruled out.    Brief Hospital Course  Shirley Black is a G1P0 who had abd cramping today and was evaluated and observed in [facility] for labort.  Cx was closed and pt was deemed stable for discharge to home.      Labs: No flowsheet data found.  Physical exam:  Blood pressure 117/67, pulse 93, temperature 97.9 F (36.6 C), temperature source Oral, resp. rate 16. General: alert and no distress Heart:S1S2, RRR, NO M/R/G. Lungs: CTA bilat,  WUJ:WJXBJYAbd:Gravid Extremities: No evidence of DVT seen on physical exam. No lower extremity edema.  Discharge Instructions: Per After Visit Summary. Activity: Advance as tolerated.   Diet: Regular Medications:  [facility]  Postpartum contraception:  Not addressed   Discharged Condition: Stable   Discharged to: Home   Barbaraann RondoCaron W Hug[place], [place] 03/25/2017

## 2017-03-25 NOTE — Progress Notes (Signed)
Discharge instructions given and explained.  Verbalized understanding.  Signed copy on chart.  

## 2017-03-29 ENCOUNTER — Inpatient Hospital Stay (HOSPITAL_COMMUNITY)
Admission: AD | Admit: 2017-03-29 | Discharge: 2017-03-29 | Disposition: A | Discharge status: Home / Self Care | Payer: Medicaid Other | Source: Ambulatory Visit | Attending: Obstetrics and Gynecology | Admitting: Obstetrics and Gynecology

## 2017-03-29 ENCOUNTER — Encounter (HOSPITAL_COMMUNITY): Payer: Self-pay

## 2017-03-29 DIAGNOSIS — N898 Other specified noninflammatory disorders of vagina: Secondary | ICD-10-CM

## 2017-03-29 DIAGNOSIS — Z3483 Encounter for supervision of other normal pregnancy, third trimester: Secondary | ICD-10-CM | POA: Insufficient documentation

## 2017-03-29 DIAGNOSIS — O471 False labor at or after 37 completed weeks of gestation: Secondary | ICD-10-CM

## 2017-03-29 DIAGNOSIS — O26893 Other specified pregnancy related conditions, third trimester: Secondary | ICD-10-CM

## 2017-03-29 LAB — AMNISURE RUPTURE OF MEMBRANE (ROM) NOT AT ARMC: AMNISURE: NEGATIVE

## 2017-03-29 LAB — POCT FERN TEST

## 2017-03-29 NOTE — Progress Notes (Addendum)
G1P0 @ [redacted] wksga. Presents to triage for r/o SROM clear fluid around 1630 while shopping today. OB office visit today and told cervix closed and bag not broken. +FM.   1839: EFM applied.   1840: SVE: 0.5/thick Fern test ordered and done.   Fern test negative  1853: provider notified. Report status of pt given. Orders received to do amnisure.   1900: amnisure done.   1909: relinquished care over to RN [place]

## 2017-03-29 NOTE — MAU Note (Signed)
Went to the hosp Monday, had some "fluid coming out", was told she was closed and her water wasn't broke.  Was at dr's office today, was measuring large.  Has fluid coming out now, and is having bad back pain. Clear fluid started about 1630

## 2017-03-29 NOTE — Discharge Instructions (Signed)

## 2017-04-08 ENCOUNTER — Other Ambulatory Visit: Payer: Self-pay | Admitting: Obstetrics and Gynecology

## 2017-04-08 DIAGNOSIS — Z113 Encounter for screening for infections with a predominantly sexual mode of transmission: Secondary | ICD-10-CM

## 2017-04-13 ENCOUNTER — Inpatient Hospital Stay
Admission: EM | Admit: 2017-04-13 | Discharge: 2017-04-17 | DRG: 765 | Disposition: A | Discharge status: Home / Self Care | Payer: Medicaid Other | Attending: Obstetrics & Gynecology | Admitting: Obstetrics & Gynecology

## 2017-04-13 DIAGNOSIS — F32A Depression, unspecified: Secondary | ICD-10-CM | POA: Diagnosis present

## 2017-04-13 DIAGNOSIS — B182 Chronic viral hepatitis C: Secondary | ICD-10-CM

## 2017-04-13 DIAGNOSIS — O134 Gestational [pregnancy-induced] hypertension without significant proteinuria, complicating childbirth: Secondary | ICD-10-CM | POA: Diagnosis present

## 2017-04-13 DIAGNOSIS — Z3A41 41 weeks gestation of pregnancy: Secondary | ICD-10-CM | POA: Diagnosis not present

## 2017-04-13 DIAGNOSIS — O99824 Streptococcus B carrier state complicating childbirth: Secondary | ICD-10-CM | POA: Diagnosis present

## 2017-04-13 DIAGNOSIS — F17210 Nicotine dependence, cigarettes, uncomplicated: Secondary | ICD-10-CM | POA: Diagnosis present

## 2017-04-13 DIAGNOSIS — Z91040 Latex allergy status: Secondary | ICD-10-CM | POA: Diagnosis not present

## 2017-04-13 DIAGNOSIS — O9952 Diseases of the respiratory system complicating childbirth: Secondary | ICD-10-CM | POA: Diagnosis present

## 2017-04-13 DIAGNOSIS — F329 Major depressive disorder, single episode, unspecified: Secondary | ICD-10-CM | POA: Diagnosis present

## 2017-04-13 DIAGNOSIS — D62 Acute posthemorrhagic anemia: Secondary | ICD-10-CM | POA: Diagnosis not present

## 2017-04-13 DIAGNOSIS — J45909 Unspecified asthma, uncomplicated: Secondary | ICD-10-CM | POA: Diagnosis present

## 2017-04-13 DIAGNOSIS — O99344 Other mental disorders complicating childbirth: Secondary | ICD-10-CM | POA: Diagnosis present

## 2017-04-13 DIAGNOSIS — O0993 Supervision of high risk pregnancy, unspecified, third trimester: Secondary | ICD-10-CM

## 2017-04-13 DIAGNOSIS — O9081 Anemia of the puerperium: Secondary | ICD-10-CM | POA: Diagnosis not present

## 2017-04-13 DIAGNOSIS — O9842 Viral hepatitis complicating childbirth: Secondary | ICD-10-CM | POA: Diagnosis present

## 2017-04-13 DIAGNOSIS — O99334 Smoking (tobacco) complicating childbirth: Secondary | ICD-10-CM | POA: Diagnosis present

## 2017-04-13 DIAGNOSIS — B951 Streptococcus, group B, as the cause of diseases classified elsewhere: Secondary | ICD-10-CM | POA: Diagnosis present

## 2017-04-13 DIAGNOSIS — O99519 Diseases of the respiratory system complicating pregnancy, unspecified trimester: Secondary | ICD-10-CM

## 2017-04-13 DIAGNOSIS — O99214 Obesity complicating childbirth: Secondary | ICD-10-CM | POA: Diagnosis present

## 2017-04-13 DIAGNOSIS — Z8759 Personal history of other complications of pregnancy, childbirth and the puerperium: Secondary | ICD-10-CM | POA: Diagnosis present

## 2017-04-13 DIAGNOSIS — Z68.41 Body mass index (BMI) pediatric, 5th percentile to less than 85th percentile for age: Secondary | ICD-10-CM

## 2017-04-13 DIAGNOSIS — O09899 Supervision of other high risk pregnancies, unspecified trimester: Secondary | ICD-10-CM

## 2017-04-13 DIAGNOSIS — O99210 Obesity complicating pregnancy, unspecified trimester: Secondary | ICD-10-CM | POA: Diagnosis present

## 2017-04-13 DIAGNOSIS — B1920 Unspecified viral hepatitis C without hepatic coma: Secondary | ICD-10-CM | POA: Diagnosis present

## 2017-04-13 DIAGNOSIS — O99340 Other mental disorders complicating pregnancy, unspecified trimester: Secondary | ICD-10-CM | POA: Diagnosis present

## 2017-04-13 DIAGNOSIS — O139 Gestational [pregnancy-induced] hypertension without significant proteinuria, unspecified trimester: Secondary | ICD-10-CM | POA: Diagnosis present

## 2017-04-13 LAB — COMPREHENSIVE METABOLIC PANEL
ALT: 36 U/L (ref 14–54)
ANION GAP: 8 (ref 5–15)
AST: 32 U/L (ref 15–41)
Albumin: 3.2 g/dL — ABNORMAL LOW (ref 3.5–5.0)
Alkaline Phosphatase: 230 U/L — ABNORMAL HIGH (ref 38–126)
BUN: 7 mg/dL (ref 6–20)
CHLORIDE: 106 mmol/L (ref 101–111)
CO2: 22 mmol/L (ref 22–32)
Calcium: 9.1 mg/dL (ref 8.9–10.3)
Creatinine, Ser: 0.49 mg/dL (ref 0.44–1.00)
GFR calc non Af Amer: >60 mL/min (ref >60)
Glucose, Bld: 80 mg/dL (ref 65–99)
Potassium: 3.9 mmol/L (ref 3.5–5.1)
SODIUM: 136 mmol/L (ref 135–145)
Total Bilirubin: 0.4 mg/dL (ref 0.3–1.2)
Total Protein: 7.4 g/dL (ref 6.5–8.1)

## 2017-04-13 LAB — CBC
HCT: 34.4 % — ABNORMAL LOW (ref 35.0–47.0)
HEMOGLOBIN: 11.6 g/dL — AB (ref 12.0–16.0)
MCH: 29.4 pg (ref 26.0–34.0)
MCHC: 33.7 g/dL (ref 32.0–36.0)
MCV: 87.3 fL (ref 80.0–100.0)
PLATELETS: 244 10*3/uL (ref 150–440)
RBC: 3.94 MIL/uL (ref 3.80–5.20)
RDW: 14.4 % (ref 11.5–14.5)
WBC: 15.8 10*3/uL — AB (ref 3.6–11.0)

## 2017-04-13 LAB — PROTEIN / CREATININE RATIO, URINE
Creatinine, Urine: 94 mg/dL
PROTEIN CREATININE RATIO: 0.11 mg/mg{creat} (ref 0.00–0.15)
TOTAL PROTEIN, URINE: 10 mg/dL

## 2017-04-13 LAB — TYPE AND SCREEN
ABO/RH(D): A POS
Antibody Screen: NEGATIVE

## 2017-04-13 MED ORDER — SOD CITRATE-CITRIC ACID 500-334 MG/5ML PO SOLN
30.0000 mL | ORAL | Status: DC | PRN
  Administered 2017-04-14: 30 mL via ORAL
  Filled 2017-04-13: qty 15

## 2017-04-13 MED ORDER — BUTORPHANOL TARTRATE 2 MG/ML IJ SOLN
INTRAMUSCULAR | Status: AC
  Administered 2017-04-14: 1 mg via INTRAVENOUS
  Filled 2017-04-13: qty 1

## 2017-04-13 MED ORDER — TERBUTALINE SULFATE 1 MG/ML IJ SOLN: 0.2500 mg | Freq: Once | INTRAMUSCULAR | Status: DC | PRN

## 2017-04-13 MED ORDER — PENICILLIN G POTASSIUM 5000000 UNITS IJ SOLR
5.0000 10*6.[IU] | Freq: Once | INTRAVENOUS | Status: AC
  Administered 2017-04-13: 5 10*6.[IU] via INTRAVENOUS
  Filled 2017-04-13: qty 5

## 2017-04-13 MED ORDER — MISOPROSTOL 25 MCG QUARTER TABLET: 25.0000 ug | ORAL_TABLET | ORAL | Status: DC | PRN

## 2017-04-13 MED ORDER — LACTATED RINGERS IV SOLN
500.0000 mL | INTRAVENOUS | Status: DC | PRN
Start: 2017-04-13 — End: 2017-04-15

## 2017-04-13 MED ORDER — OXYTOCIN 40 UNITS IN LACTATED RINGERS INFUSION - SIMPLE MED: 2.5000 [IU]/h | INTRAVENOUS | Status: DC

## 2017-04-13 MED ORDER — LIDOCAINE HCL (PF) 1 % IJ SOLN: 30.0000 mL | INTRAMUSCULAR | Status: DC | PRN

## 2017-04-13 MED ORDER — OXYTOCIN BOLUS FROM INFUSION: 500.0000 mL | Freq: Once | INTRAVENOUS | Status: DC

## 2017-04-13 MED ORDER — OXYTOCIN 40 UNITS IN LACTATED RINGERS INFUSION - SIMPLE MED
1.0000 m[IU]/min | INTRAVENOUS | Status: DC
  Administered 2017-04-14: 2 m[IU]/min via INTRAVENOUS
  Filled 2017-04-13: qty 1000

## 2017-04-13 MED ORDER — BUTORPHANOL TARTRATE 1 MG/ML IJ SOLN
1.0000 mg | INTRAMUSCULAR | Status: DC | PRN
  Administered 2017-04-13 – 2017-04-14 (×3): 1 mg via INTRAVENOUS
  Filled 2017-04-13: qty 1

## 2017-04-13 MED ORDER — LACTATED RINGERS IV SOLN
INTRAVENOUS | Status: DC
  Administered 2017-04-13 – 2017-04-14 (×3): via INTRAVENOUS

## 2017-04-13 MED ORDER — ONDANSETRON HCL 4 MG/2ML IJ SOLN: 4.0000 mg | Freq: Four times a day (QID) | INTRAMUSCULAR | Status: DC | PRN

## 2017-04-13 MED ORDER — ACETAMINOPHEN 325 MG PO TABS
650.0000 mg | ORAL_TABLET | ORAL | Status: DC | PRN
  Administered 2017-04-14: 650 mg via ORAL
  Filled 2017-04-13: qty 2

## 2017-04-13 MED ORDER — PENICILLIN G POT IN DEXTROSE 60000 UNIT/ML IV SOLN
3.0000 10*6.[IU] | INTRAVENOUS | Status: DC
  Administered 2017-04-14 (×5): 3 10*6.[IU] via INTRAVENOUS
  Filled 2017-04-13 (×15): qty 50

## 2017-04-13 NOTE — H&P (Signed)
OB History & Physical   History of Present Illness:  Chief Complaint:   HPI:  Shirley Black is a 21 y.o. G1P0 female at [redacted]w[redacted]d dated by LMP 06/19/17 and ultrasound @ 11wks with Estimated Date of Delivery: 04/03/17 She presents to L&D with contractions  +FM, + CTX, no LOF, no VB   Pregnancy Issues: 1. HEPATITIS C - last viral load >1,700.000 2. Obesity (bmi 39) 3. Teen pregnancy 4. GBS positive 5. Varicella non-immune 6. History of depression and anxiety 7. History of asthma  Maternal Medical History:   Past Medical History:  Diagnosis Date  . Anxiety   . Asthma   . Depression   . Hepatitis C     Past Surgical History:  Procedure Laterality Date  . TONSILECTOMY, ADENOIDECTOMY, BILATERAL MYRINGOTOMY AND TUBES    . TONSILLECTOMY      Allergies  Allergen Reactions  . Latex Hives  . Adhesive [Tape] Rash    Prior to Admission medications   Medication Sig Start Date End Date Taking? Authorizing Provider  acetaminophen (TYLENOL) 500 MG tablet Take 500 mg by mouth every 6 (six) hours as needed for mild pain or headache.   Yes [provider]  albuterol (PROVENTIL HFA;VENTOLIN HFA) 108 (90 Base) MCG/ACT inhaler Inhale 2 puffs into the lungs every 6 (six) hours as needed for wheezing or shortness of breath. 01/13/17  Yes Veronese, [place], MD  lansoprazole (PREVACID) 15 MG capsule Take 15 mg by mouth daily as needed (for acid reflux).   Yes [provider]  Prenatal Vit-Fe Fumarate-FA (PRENATAL MULTIVITAMIN) TABS tablet Take 1 tablet by mouth daily at 12 noon.   Yes [provider]  triamcinolone cream (KENALOG) 0.5 % Apply 1 application topically daily. 03/15/17  Yes [provider]     Prenatal care site: [facility]    Social History: She  reports that she has been smoking Cigarettes.  She has been smoking about 0.25 packs per day. She has never used smokeless tobacco. She reports that she does not drink alcohol or use  drugs.  Family History: family history includes Asthma in her father; Cancer in her maternal grandmother and paternal grandmother; Depression in her father, mother, and sister; Diabetes in her mother.   Review of Systems: A full review of systems was performed and negative except as noted in the HPI.     Physical Exam:  Vital Signs: BP 139/79   Pulse (!) 108   Temp 97.9 F (36.6 C) (Oral)  General: no acute distress.  HEENT: normocephalic, atraumatic Heart: regular rate & rhythm.  No murmurs/rubs/gallops Lungs: clear to auscultation bilaterally, normal respiratory effort Abdomen: soft, gravid, non-tender;  EFW:8.5 Pelvic:   External: Normal external female genitalia  Cervix: Dilation: 1 / Effacement (%): 50 /      Extremities: non-tender, symmetric, 1+ edema bilaterally.  DTRs: 2+ Neurologic: Alert & oriented x 3.    Results for orders placed or performed during the hospital encounter of 04/13/17 (from the past 24 hour(s))  CBC     Status: Abnormal   Collection Time: 04/13/17  6:02 PM  Result Value Ref Range   WBC 15.8 (H) 3.6 - 11.0 K/uL   RBC 3.94 3.80 - 5.20 MIL/uL   Hemoglobin 11.6 (L) 12.0 - 16.0 g/dL   HCT 16.1 (L) 09.6 - 04.5 %   MCV 87.3 80.0 - 100.0 fL   MCH 29.4 26.0 - 34.0 pg   MCHC 33.7 32.0 - 36.0 g/dL  RDW 14.4 11.5 - 14.5 %   Platelets 244 150 - 440 K/uL  Type and screen [facility]     Status: None (Preliminary result)   Collection Time: 04/13/17  6:02 PM  Result Value Ref Range   ABO/RH(D) PENDING    Antibody Screen PENDING    Sample Expiration 04/16/2017   Comprehensive metabolic panel     Status: Abnormal   Collection Time: 04/13/17  6:02 PM  Result Value Ref Range   Sodium 136 135 - 145 mmol/L   Potassium 3.9 3.5 - 5.1 mmol/L   Chloride 106 101 - 111 mmol/L   CO2 22 22 - 32 mmol/L   Glucose, Bld 80 65 - 99 mg/dL   BUN 7 6 - 20 mg/dL   Creatinine, Ser 1.610.49 0.44 - 1.00 mg/dL   Calcium 9.1 8.9 - 09.610.3 mg/dL   Total Protein  7.4 6.5 - 8.1 g/dL   Albumin 3.2 (L) 3.5 - 5.0 g/dL   AST 32 15 - 41 U/L   ALT 36 14 - 54 U/L   Alkaline Phosphatase 230 (H) 38 - 126 U/L   Total Bilirubin 0.4 0.3 - 1.2 mg/dL   GFR calc non Af Amer >60 >60 mL/min   GFR calc Af Amer >60 >60 mL/min   Anion gap 8 5 - 15  Protein / creatinine ratio, urine     Status: None   Collection Time: 04/13/17  6:50 PM  Result Value Ref Range   Creatinine, Urine 94 mg/dL   Total Protein, Urine 10 mg/dL   Protein Creatinine Ratio 0.11 0.00 - 0.15 mg/mg[Cre]    Pertinent Results:  Prenatal Labs: Blood type/Rh A+  Antibody screen neg  Rubella Immune  Varicella Non-Immune  RPR NR  HBsAg Neg  HIV NR  GC neg  Chlamydia neg  Genetic screening negative  1 hour GTT Early 126, 28wks 145  3 hour GTT Did not do  GBS positive   FHT: 120 mod + accels no decels TOCO: q 2min SVE:  Dilation: 1 / Effacement (%): 50 /      Cephalic by leopolds  No results found.  Assessment:  Shirley Black is a 21 y.o. G1P0 female at 7064w3d with new onset GHTN.   Plan:  1. Admit to Labor & Delivery 2. CBC, T&S, Clrs, IVF 3. GBS positive, PCN ordered  4. Consents obtained. 5. Continuous efm/toco 6. GHTN - p/c ratio nor LFTs diagnostic for preeclampsia.  No s/sx.  Continue to monitor. 7. Hep C- minimize fetal interventions, no FSE, AROM, IUPC  8. Will recheck and if no change will begin IOL. ----- Ranae Plumberhelsea Marijah Larranaga, MD Attending Obstetrician and Gynecologist [facility], Department of OB/GYN [facility]

## 2017-04-13 NOTE — OB Triage Note (Signed)
Patient presents to triage for complaints of contractions every 5- 7 minutes lasting about 30 to 40 seconds each. No gushing of fluids, no bleeding.

## 2017-04-14 ENCOUNTER — Inpatient Hospital Stay: Payer: Medicaid Other | Admitting: Anesthesiology

## 2017-04-14 ENCOUNTER — Encounter
Admission: EM | Disposition: A | Discharge status: Home / Self Care | Payer: Self-pay | Source: Home / Self Care | Attending: Obstetrics & Gynecology

## 2017-04-14 ENCOUNTER — Encounter: Payer: Self-pay | Admitting: Anesthesiology

## 2017-04-14 SURGERY — Surgical Case
Anesthesia: Epidural | Site: Abdomen | Wound class: Clean Contaminated

## 2017-04-14 MED ORDER — LIDOCAINE HCL (PF) 1 % IJ SOLN
INTRAMUSCULAR | Status: DC | PRN
  Administered 2017-04-14: 4 mL via SUBCUTANEOUS

## 2017-04-14 MED ORDER — CEFAZOLIN SODIUM-DEXTROSE 2-4 GM/100ML-% IV SOLN
2.0000 g | INTRAVENOUS | Status: AC
  Administered 2017-04-15: 2 g via INTRAVENOUS
  Filled 2017-04-14: qty 100

## 2017-04-14 MED ORDER — FENTANYL 2.5 MCG/ML W/ROPIVACAINE 0.15% IN NS 100 ML EPIDURAL (ARMC)
EPIDURAL | Status: DC | PRN
  Administered 2017-04-14: 12 mL/h via EPIDURAL

## 2017-04-14 MED ORDER — PHENYLEPHRINE 40 MCG/ML (10ML) SYRINGE FOR IV PUSH (FOR BLOOD PRESSURE SUPPORT): 80.0000 ug | PREFILLED_SYRINGE | INTRAVENOUS | Status: DC | PRN

## 2017-04-14 MED ORDER — FENTANYL 2.5 MCG/ML W/ROPIVACAINE 0.15% IN NS 100 ML EPIDURAL (ARMC)
EPIDURAL | Status: AC
  Filled 2017-04-14: qty 100

## 2017-04-14 MED ORDER — BUPIVACAINE HCL (PF) 0.25 % IJ SOLN
INTRAMUSCULAR | Status: DC | PRN
  Administered 2017-04-14 (×2): 5 mL via EPIDURAL

## 2017-04-14 MED ORDER — CHLOROPROCAINE HCL (PF) 3 % IJ SOLN
INTRAMUSCULAR | Status: AC
  Filled 2017-04-14: qty 20

## 2017-04-14 MED ORDER — DIPHENHYDRAMINE HCL 50 MG/ML IJ SOLN: 12.5000 mg | INTRAMUSCULAR | Status: DC | PRN

## 2017-04-14 MED ORDER — BUPIVACAINE LIPOSOME 1.3 % IJ SUSP
20.0000 mL | Freq: Once | INTRAMUSCULAR | Status: DC
  Filled 2017-04-14: qty 20

## 2017-04-14 MED ORDER — BUPIVACAINE HCL (PF) 0.5 % IJ SOLN
INTRAMUSCULAR | Status: AC
  Filled 2017-04-14: qty 30

## 2017-04-14 MED ORDER — LACTATED RINGERS IV SOLN: 500.0000 mL | Freq: Once | INTRAVENOUS | Status: DC

## 2017-04-14 MED ORDER — EPHEDRINE 5 MG/ML INJ: 10.0000 mg | INTRAVENOUS | Status: DC | PRN

## 2017-04-14 MED ORDER — FENTANYL 2.5 MCG/ML W/ROPIVACAINE 0.15% IN NS 100 ML EPIDURAL (ARMC)
12.0000 mL/h | EPIDURAL | Status: DC
  Administered 2017-04-14: 12 mL/h via EPIDURAL
  Filled 2017-04-14 (×2): qty 100

## 2017-04-14 MED ORDER — SODIUM CHLORIDE 0.9 % IJ SOLN
INTRAMUSCULAR | Status: AC
  Filled 2017-04-14: qty 50

## 2017-04-14 MED ORDER — LIDOCAINE-EPINEPHRINE (PF) 1.5 %-1:200000 IJ SOLN
INTRAMUSCULAR | Status: DC | PRN
  Administered 2017-04-14: 3 mL via EPIDURAL

## 2017-04-14 SURGICAL SUPPLY — 33 items
BARRIER ADHS 3X4 INTERCEED (GAUZE/BANDAGES/DRESSINGS) ×2 IMPLANT
CANISTER SUCT 3000ML PPV (MISCELLANEOUS) ×2 IMPLANT
CHLORAPREP W/TINT 26ML (MISCELLANEOUS) ×4 IMPLANT
DRSG TEGADERM 4X4.75 (GAUZE/BANDAGES/DRESSINGS) ×2 IMPLANT
DRSG TEGADERM 6X8 (GAUZE/BANDAGES/DRESSINGS) ×2 IMPLANT
DRSG TELFA 3X8 NADH (GAUZE/BANDAGES/DRESSINGS) ×2 IMPLANT
ELECT REM PT RETURN 9FT ADLT (ELECTROSURGICAL) ×2
ELECTRODE REM PT RTRN 9FT ADLT (ELECTROSURGICAL) ×1 IMPLANT
GAUZE SPONGE 4X4 12PLY STRL (GAUZE/BANDAGES/DRESSINGS) IMPLANT
GLOVE PROTEXIS LATEX SZ 7.5 (GLOVE) ×24 IMPLANT
GOWN STRL REUS W/ TWL LRG LVL3 (GOWN DISPOSABLE) ×3 IMPLANT
GOWN STRL REUS W/TWL LRG LVL3 (GOWN DISPOSABLE) ×3
HANDLE YANKAUER SUCT BULB TIP (MISCELLANEOUS) ×2 IMPLANT
NS IRRIG 1000ML POUR BTL (IV SOLUTION) ×2 IMPLANT
PAD OB MATERNITY 4.3X12.25 (PERSONAL CARE ITEMS) ×4 IMPLANT
PAD PREP 24X41 OB/GYN DISP (PERSONAL CARE ITEMS) ×2 IMPLANT
RETRACTOR TRAXI PANNICULUS (MISCELLANEOUS) ×1 IMPLANT
RTRCTR C-SECT PINK 25CM LRG (MISCELLANEOUS) ×2 IMPLANT
SPONGE LAP 18X18 5 PK (GAUZE/BANDAGES/DRESSINGS) ×2 IMPLANT
STAPLER INSORB 30 2030 C-SECTI (MISCELLANEOUS) ×2 IMPLANT
SUCT VACUUM KIWI BELL (SUCTIONS) IMPLANT
SUT MNCRL 4-0 (SUTURE)
SUT MNCRL 4-0 27XMFL (SUTURE)
SUT PDS AB 1 TP1 96 (SUTURE) ×4 IMPLANT
SUT PLAIN 2 0 XLH (SUTURE) ×2 IMPLANT
SUT PLAIN GUT 2-0 30 C14 SG823 (SUTURE) ×2
SUT VIC AB 0 CT1 36 (SUTURE) ×6 IMPLANT
SUT VIC AB 3-0 SH 27 (SUTURE) ×1
SUT VIC AB 3-0 SH 27X BRD (SUTURE) ×1 IMPLANT
SUTURE MNCRL 4-0 27XMF (SUTURE) IMPLANT
SUTURE PLN GUT2-0 30 C14 SG823 (SUTURE) ×1 IMPLANT
TRAXI PANNICULUS RETRACTOR (MISCELLANEOUS) ×1
TUBE CONNECTING 6X3/16 (MISCELLANEOUS) ×4 IMPLANT

## 2017-04-14 NOTE — Anesthesia Preprocedure Evaluation (Signed)
Anesthesia Evaluation  Patient identified by MRN, date of birth, ID band Patient awake    Reviewed: Allergy & Precautions, H&P , NPO status , Patient's Chart, lab work & pertinent test results, reviewed documented beta blocker date and time   History of Anesthesia Complications Negative for: history of anesthetic complications  Airway Mallampati: II  TM Distance: >3 FB Neck ROM: full    Dental  (+) Teeth Intact   Pulmonary neg shortness of breath, asthma , neg recent URI, Current Smoker,           Cardiovascular Exercise Tolerance: Good negative cardio ROS       Neuro/Psych PSYCHIATRIC DISORDERS (Depression) negative neurological ROS     GI/Hepatic GERD  ,(+) Hepatitis -, C  Endo/Other  neg diabetesMorbid obesity  Renal/GU negative Renal ROS  negative genitourinary   Musculoskeletal   Abdominal   Peds  Hematology negative hematology ROS (+)   Anesthesia Other Findings Past Medical History: No date: Anxiety No date: Asthma No date: Depression No date: Hepatitis C   Reproductive/Obstetrics (+) Pregnancy                             Anesthesia Physical Anesthesia Plan  ASA: III  Anesthesia Plan: Epidural   Post-op Pain Management:    Induction:   PONV Risk Score and Plan:   Airway Management Planned:   Additional Equipment:   Intra-op Plan:   Post-operative Plan:   Informed Consent: I have reviewed the patients History and Physical, chart, labs and discussed the procedure including the risks, benefits and alternatives for the proposed anesthesia with the patient or authorized representative who has indicated his/her understanding and acceptance.   Dental Advisory Given  Plan Discussed with: Anesthesiologist, CRNA and Surgeon  Anesthesia Plan Comments:         Anesthesia Quick Evaluation

## 2017-04-14 NOTE — Anesthesia Procedure Notes (Signed)
Epidural Patient location during procedure: OB Start time: 04/14/2017 6:37 AM End time: 04/14/2017 6:47 AM  Staffing Anesthesiologist: Lenard SimmerKARENZ, Hosey Burmester Performed: anesthesiologist   Preanesthetic Checklist Completed: patient identified, site marked, surgical consent, pre-op evaluation, timeout performed, IV checked, risks and benefits discussed and monitors and equipment checked  Epidural Patient position: sitting Prep: ChloraPrep Patient monitoring: heart rate, continuous pulse ox and blood pressure Approach: midline Location: L3-L4 Injection technique: LOR saline  Needle:  Needle type: Tuohy  Needle gauge: 17 G Needle length: 9 cm and 9 Needle insertion depth: 8 cm Catheter type: closed end flexible Catheter size: 19 Gauge Catheter at skin depth: 13 cm Test dose: negative and 1.5% lidocaine with Epi 1:200 K  Assessment Sensory level: T10 Events: blood not aspirated, injection not painful, no injection resistance, negative IV test and no paresthesia  Additional Notes Pt. Evaluated and documentation done after procedure finished. Patient identified. Risks/Benefits/Options discussed with patient including but not limited to bleeding, infection, nerve damage, paralysis, failed block, incomplete pain control, headache, blood pressure changes, nausea, vomiting, reactions to medication both or allergic, itching and postpartum back pain. Confirmed with bedside nurse the patient's most recent platelet count. Confirmed with patient that they are not currently taking any anticoagulation, have any bleeding history or any family history of bleeding disorders. Patient expressed understanding and wished to proceed. All questions were answered. Sterile technique was used throughout the entire procedure. Please see nursing notes for vital signs. Test dose was given through epidural catheter and negative prior to continuing to dose epidural or start infusion. Warning signs of high block given to the  patient including shortness of breath, tingling/numbness in hands, complete motor block, or any concerning symptoms with instructions to call for help. Patient was given instructions on fall risk and not to get out of bed. All questions and concerns addressed with instructions to call with any issues or inadequate analgesia.   Patient tolerated the insertion well without immediate complications.Reason for block:procedure for pain

## 2017-04-14 NOTE — Progress Notes (Signed)
Shirley Black is a 21 y.o. G1P0 at 10612w4d, hepatitis C, +GBS, comfortable with epidural. 1 hr ago with clear discharge, +nitrazine  Subjective: Considering requesting epidural  Objective: BP (!) 114/98   Pulse 99   Temp 98.3 F (36.8 C) (Oral)   Resp 18   SpO2 98%  No intake/output data recorded. No intake/output data recorded.  FHT:  FHR: 140 bpm, variability: moderate,  accelerations:  Present,  decelerations:  Absent UC:   regular, every 1-2 minutes SVE:   Dilation: 4 Effacement (%): 80 Station: -2 Exam by:: Rafia Shedden  Noticeable forebag of fluid  Labs: Lab Results  Component Value Date   WBC 15.8 (H) 04/13/2017   HGB 11.6 (L) 04/13/2017   HCT 34.4 (L) 04/13/2017   MCV 87.3 04/13/2017   PLT 244 04/13/2017    Assessment / Plan: Augmentation of labor, on pitocin since 0645 this morning, now on pitocin of 18   Pt considering requesting cesarean as no cervical change on pitocin, and she is worried about transmission of Hep C to her baby. We discussed the evidence recommending no cesarean for Hep C, only for obstetric indication. However, she has made no cervical change on pitocin. Will continue to avoid AROM and internal monitoring, and recheck in 1 hr. If no cervical change, will revisit elective cesarean. She desires 2 children.    Christeen DouglasBethany Mariadejesus Cade 04/14/2017, 4:37 PM

## 2017-04-14 NOTE — Progress Notes (Signed)
Shirley Black is a 21 y.o. G1P0 at 2639w4d, pitocin at 18  Subjective: Still comfortable with epidural, tolerating labor well  Objective: BP 124/69   Pulse 96   Temp 98.5 F (36.9 C) (Oral)   Resp 18   SpO2 97%  No intake/output data recorded. No intake/output data recorded.  FHT:  FHR: 145 bpm, variability: moderate,  accelerations:  Present,  decelerations:  Absent UC:   regular, every 2 minutes SVE:   Dilation: 5 Effacement (%): 90 Station: -2 Exam by:: MarriottBeasley  Labs: Lab Results  Component Value Date   WBC 15.8 (H) 04/13/2017   HGB 11.6 (L) 04/13/2017   HCT 34.4 (L) 04/13/2017   MCV 87.3 04/13/2017   PLT 244 04/13/2017    Assessment / Plan:  - Will continue with labor - bulging bag of water still intact - Cat I FHT - Cervix unchanged  Christeen DouglasBethany Azura Tufaro 04/14/2017, 7:27 PM

## 2017-04-14 NOTE — Progress Notes (Signed)
Shirley Black is a 21 y.o. G1P0 at 3838w4d, augmentation with pitocin 6618mu/min. BP elevated on admission, but now normal  Subjective: comfortable  Objective: BP 111/66   Pulse 96   Temp 98.5 F (36.9 C) (Oral)   Resp 18   SpO2 98%  No intake/output data recorded. No intake/output data recorded.  FHT:  FHR: 145 bpm, variability: moderate,  accelerations:  Present,  decelerations:  Absent UC:   regular, every 1-2 minutes SVE:   Dilation: 5 Effacement (%): 90 Station: -2 Exam by:: Shirley Black   Labs: Lab Results  Component Value Date   WBC 15.8 (H) 04/13/2017   HGB 11.6 (L) 04/13/2017   HCT 34.4 (L) 04/13/2017   MCV 87.3 04/13/2017   PLT 244 04/13/2017    Assessment / Plan: - making cervical change - bulging bag of water - will continue augmentation with pitocin, watch labor curve, and continue no internal monitoring or intervention  Christeen DouglasBethany Zowie Lundahl 04/14/2017, 5:50 PM

## 2017-04-14 NOTE — Anesthesia Procedure Notes (Signed)
Epidural Patient location during procedure: OB Start time: 04/14/2017 7:47 AM End time: 04/14/2017 7:57 AM  Staffing Anesthesiologist: Lenard SimmerKARENZ, Daulton Harbaugh Performed: anesthesiologist   Preanesthetic Checklist Completed: patient identified, site marked, surgical consent, pre-op evaluation, timeout performed, IV checked, risks and benefits discussed and monitors and equipment checked  Epidural Patient position: sitting Prep: ChloraPrep Patient monitoring: heart rate, continuous pulse ox and blood pressure Approach: midline Location: L3-L4 Injection technique: LOR saline  Needle:  Needle type: Tuohy  Needle gauge: 17 G Needle length: 9 cm and 9 Needle insertion depth: 7 cm Catheter type: closed end flexible Catheter size: 19 Gauge Catheter at skin depth: 12 cm Test dose: negative and 1.5% lidocaine with Epi 1:200 K  Assessment Sensory level: T10 Events: blood not aspirated, injection not painful, no injection resistance, negative IV test and no paresthesia  Additional Notes Pt. Evaluated and documentation done after procedure finished. Patient identified. Risks/Benefits/Options discussed with patient including but not limited to bleeding, infection, nerve damage, paralysis, failed block, incomplete pain control, headache, blood pressure changes, nausea, vomiting, reactions to medication both or allergic, itching and postpartum back pain. Confirmed with bedside nurse the patient's most recent platelet count. Confirmed with patient that they are not currently taking any anticoagulation, have any bleeding history or any family history of bleeding disorders. Patient expressed understanding and wished to proceed. All questions were answered. Sterile technique was used throughout the entire procedure. Please see nursing notes for vital signs. Test dose was given through epidural catheter and negative prior to continuing to dose epidural or start infusion. Warning signs of high block given to the  patient including shortness of breath, tingling/numbness in hands, complete motor block, or any concerning symptoms with instructions to call for help. Patient was given instructions on fall risk and not to get out of bed. All questions and concerns addressed with instructions to call with any issues or inadequate analgesia.   Patient tolerated the insertion well without immediate complications.Reason for block:procedure for pain

## 2017-04-14 NOTE — Progress Notes (Signed)
Shirley Black is a 21 y.o. G1P0 at 7830w4d, now with arrest of dilation  Subjective: Comfortable with epidural  Objective: BP 120/78   Pulse (!) 104   Temp 98.5 F (36.9 C) (Oral)   Resp 18   SpO2 95%  No intake/output data recorded. No intake/output data recorded.  FHT:  FHR: 140 bpm, variability: moderate,  accelerations:  Present,  decelerations:  Absent UC:   regular, every 2 minutes SVE:   Dilation: 5 Effacement (%): 90 Station: -2 Exam by:: Kaytelynn Scripter  Labs: Lab Results  Component Value Date   WBC 15.8 (H) 04/13/2017   HGB 11.6 (L) 04/13/2017   HCT 34.4 (L) 04/13/2017   MCV 87.3 04/13/2017   PLT 244 04/13/2017    Assessment / Plan:  - Pt now arrest of dilation, on pitocin 4818mu/min without any cervical change or descent x>6hrs.  - FHT Cat I - Hepatitis C: Still intact, will continue no intrauterine monitoring, bathe baby before any procedure - proceed with pLTCS - The risks of cesarean section discussed with the patient included but were not limited to: bleeding which may require transfusion or reoperation; infection which may require antibiotics; injury to bowel, bladder, ureters or other surrounding organs; injury to the fetus; need for additional procedures including hysterectomy in the event of a life-threatening hemorrhage; placental abnormalities wth subsequent pregnancies, incisional problems, thromboembolic phenomenon and other postoperative/anesthesia complications. The patient concurred with the proposed plan, giving informed written consent for the procedure.   Patient has been on clears with last intake 5 hrs ago. Anesthesia and OR aware. Preoperative prophylactic antibiotics and SCDs ordered on call to the OR.  To OR when ready.   Christeen DouglasBethany Fortino Haag 04/14/2017, 11:13 PM

## 2017-04-15 ENCOUNTER — Encounter: Payer: Self-pay | Admitting: Obstetrics and Gynecology

## 2017-04-15 LAB — RPR: RPR: NONREACTIVE

## 2017-04-15 MED ORDER — COCONUT OIL OIL: 1.0000 "application " | TOPICAL_OIL | Status: DC | PRN

## 2017-04-15 MED ORDER — MEDROXYPROGESTERONE ACETATE 150 MG/ML IM SUSP
150.0000 mg | INTRAMUSCULAR | Status: AC | PRN
  Administered 2017-04-17: 150 mg via INTRAMUSCULAR

## 2017-04-15 MED ORDER — HYDROMORPHONE HCL 1 MG/ML IJ SOLN
0.5000 mg | INTRAMUSCULAR | Status: DC | PRN
  Administered 2017-04-15: 0.5 mg via INTRAVENOUS

## 2017-04-15 MED ORDER — IBUPROFEN 600 MG PO TABS
600.0000 mg | ORAL_TABLET | Freq: Four times a day (QID) | ORAL | Status: DC
  Administered 2017-04-15 – 2017-04-17 (×9): 600 mg via ORAL
  Filled 2017-04-15 (×9): qty 1

## 2017-04-15 MED ORDER — PHENYLEPHRINE HCL 10 MG/ML IJ SOLN
INTRAMUSCULAR | Status: AC
  Filled 2017-04-15: qty 1

## 2017-04-15 MED ORDER — DIBUCAINE 1 % RE OINT: 1.0000 "application " | TOPICAL_OINTMENT | RECTAL | Status: DC | PRN

## 2017-04-15 MED ORDER — BISACODYL 10 MG RE SUPP: 10.0000 mg | Freq: Every day | RECTAL | Status: DC | PRN

## 2017-04-15 MED ORDER — PHENYLEPHRINE HCL 10 MG/ML IJ SOLN
INTRAMUSCULAR | Status: DC | PRN
  Administered 2017-04-15 (×2): 100 ug via INTRAVENOUS

## 2017-04-15 MED ORDER — OXYCODONE-ACETAMINOPHEN 5-325 MG PO TABS
1.0000 | ORAL_TABLET | ORAL | Status: DC | PRN
  Administered 2017-04-15: 1 via ORAL
  Filled 2017-04-15: qty 1

## 2017-04-15 MED ORDER — PRENATAL PLUS 27-1 MG PO TABS
1.0000 | ORAL_TABLET | Freq: Every day | ORAL | Status: DC
  Administered 2017-04-15 – 2017-04-16 (×2): 1 via ORAL
  Filled 2017-04-15 (×2): qty 1

## 2017-04-15 MED ORDER — MIDAZOLAM HCL 2 MG/2ML IJ SOLN
INTRAMUSCULAR | Status: AC
  Filled 2017-04-15: qty 2

## 2017-04-15 MED ORDER — KETOROLAC TROMETHAMINE 30 MG/ML IJ SOLN
INTRAMUSCULAR | Status: DC | PRN
  Administered 2017-04-15: 30 mg via INTRAVENOUS

## 2017-04-15 MED ORDER — FENTANYL CITRATE (PF) 100 MCG/2ML IJ SOLN
INTRAMUSCULAR | Status: AC
  Administered 2017-04-15: 25 ug via INTRAVENOUS
  Filled 2017-04-15: qty 2

## 2017-04-15 MED ORDER — OXYTOCIN 40 UNITS IN LACTATED RINGERS INFUSION - SIMPLE MED
INTRAVENOUS | Status: DC | PRN
  Administered 2017-04-15: 1000 mL via INTRAVENOUS

## 2017-04-15 MED ORDER — MENTHOL 3 MG MT LOZG
1.0000 | LOZENGE | OROMUCOSAL | Status: DC | PRN
Start: 2017-04-15 — End: 2017-04-17
  Filled 2017-04-15: qty 9

## 2017-04-15 MED ORDER — OXYTOCIN 40 UNITS IN LACTATED RINGERS INFUSION - SIMPLE MED
2.5000 [IU]/h | INTRAVENOUS | Status: AC
  Administered 2017-04-15: 2.5 [IU]/h via INTRAVENOUS
  Filled 2017-04-15: qty 1000

## 2017-04-15 MED ORDER — FLEET ENEMA 7-19 GM/118ML RE ENEM: 1.0000 | ENEMA | Freq: Every day | RECTAL | Status: DC | PRN

## 2017-04-15 MED ORDER — SIMETHICONE 80 MG PO CHEW: 80.0000 mg | CHEWABLE_TABLET | ORAL | Status: DC | PRN

## 2017-04-15 MED ORDER — KETOROLAC TROMETHAMINE 30 MG/ML IJ SOLN
30.0000 mg | Freq: Once | INTRAMUSCULAR | Status: AC
  Administered 2017-04-15: 30 mg via INTRAVENOUS

## 2017-04-15 MED ORDER — MEASLES, MUMPS & RUBELLA VAC ~~LOC~~ INJ
0.5000 mL | INJECTION | Freq: Once | SUBCUTANEOUS | Status: DC
  Filled 2017-04-15: qty 0.5

## 2017-04-15 MED ORDER — HYDROMORPHONE HCL 1 MG/ML IJ SOLN
INTRAMUSCULAR | Status: AC
  Filled 2017-04-15: qty 1

## 2017-04-15 MED ORDER — ONDANSETRON HCL 4 MG/2ML IJ SOLN
INTRAMUSCULAR | Status: DC | PRN
  Administered 2017-04-15: 4 mg via INTRAVENOUS

## 2017-04-15 MED ORDER — CHLOROPROCAINE HCL (PF) 3 % IJ SOLN
INTRAMUSCULAR | Status: DC | PRN
  Administered 2017-04-14 (×3): 3 mL

## 2017-04-15 MED ORDER — OXYTOCIN 10 UNIT/ML IJ SOLN
INTRAMUSCULAR | Status: AC
  Filled 2017-04-15: qty 4

## 2017-04-15 MED ORDER — WITCH HAZEL-GLYCERIN EX PADS
1.0000 | MEDICATED_PAD | CUTANEOUS | Status: DC | PRN
Start: 2017-04-15 — End: 2017-04-17

## 2017-04-15 MED ORDER — METHYLERGONOVINE MALEATE 0.2 MG/ML IJ SOLN
INTRAMUSCULAR | Status: DC | PRN
  Administered 2017-04-15: 0.2 mg via INTRAMUSCULAR

## 2017-04-15 MED ORDER — OXYCODONE-ACETAMINOPHEN 5-325 MG PO TABS
2.0000 | ORAL_TABLET | ORAL | Status: DC | PRN
  Administered 2017-04-15 – 2017-04-17 (×12): 2 via ORAL
  Filled 2017-04-15 (×12): qty 2

## 2017-04-15 MED ORDER — KETAMINE HCL 50 MG/ML IJ SOLN
INTRAMUSCULAR | Status: AC
  Filled 2017-04-15: qty 10

## 2017-04-15 MED ORDER — TETANUS-DIPHTH-ACELL PERTUSSIS 5-2.5-18.5 LF-MCG/0.5 IM SUSP: 0.5000 mL | Freq: Once | INTRAMUSCULAR | Status: DC

## 2017-04-15 MED ORDER — SENNOSIDES-DOCUSATE SODIUM 8.6-50 MG PO TABS
2.0000 | ORAL_TABLET | ORAL | Status: DC
  Administered 2017-04-15 – 2017-04-16 (×2): 2 via ORAL
  Filled 2017-04-15 (×2): qty 2

## 2017-04-15 MED ORDER — SIMETHICONE 80 MG PO CHEW
80.0000 mg | CHEWABLE_TABLET | ORAL | Status: DC
  Administered 2017-04-15 – 2017-04-16 (×2): 80 mg via ORAL
  Filled 2017-04-15 (×2): qty 1

## 2017-04-15 MED ORDER — FENTANYL CITRATE (PF) 100 MCG/2ML IJ SOLN
INTRAMUSCULAR | Status: AC
  Filled 2017-04-15: qty 2

## 2017-04-15 MED ORDER — KETAMINE HCL 50 MG/ML IJ SOLN
INTRAMUSCULAR | Status: DC | PRN
  Administered 2017-04-15 (×2): 50 mg via INTRAMUSCULAR

## 2017-04-15 MED ORDER — MIDAZOLAM HCL 2 MG/2ML IJ SOLN
INTRAMUSCULAR | Status: DC | PRN
  Administered 2017-04-15 (×2): 2 mg via INTRAVENOUS

## 2017-04-15 MED ORDER — BUPIVACAINE HCL (PF) 0.5 % IJ SOLN
INTRAMUSCULAR | Status: DC | PRN
  Administered 2017-04-15: 100 mL

## 2017-04-15 MED ORDER — METHYLERGONOVINE MALEATE 0.2 MG/ML IJ SOLN
INTRAMUSCULAR | Status: AC
  Filled 2017-04-15: qty 1

## 2017-04-15 MED ORDER — DIPHENHYDRAMINE HCL 25 MG PO CAPS: 25.0000 mg | ORAL_CAPSULE | Freq: Four times a day (QID) | ORAL | Status: DC | PRN

## 2017-04-15 MED ORDER — FENTANYL CITRATE (PF) 100 MCG/2ML IJ SOLN
INTRAMUSCULAR | Status: DC | PRN
  Administered 2017-04-15 (×2): 50 ug via INTRAVENOUS

## 2017-04-15 MED ORDER — ACETAMINOPHEN 325 MG PO TABS: 650.0000 mg | ORAL_TABLET | ORAL | Status: DC | PRN

## 2017-04-15 MED ORDER — KETOROLAC TROMETHAMINE 30 MG/ML IJ SOLN
INTRAMUSCULAR | Status: AC
  Administered 2017-04-15: 30 mg via INTRAVENOUS
  Filled 2017-04-15: qty 1

## 2017-04-15 MED ORDER — LACTATED RINGERS IV SOLN: INTRAVENOUS | Status: DC

## 2017-04-15 MED ORDER — FENTANYL CITRATE (PF) 100 MCG/2ML IJ SOLN
25.0000 ug | INTRAMUSCULAR | Status: DC | PRN
  Administered 2017-04-15 (×4): 25 ug via INTRAVENOUS

## 2017-04-15 MED ORDER — HYDROMORPHONE HCL 1 MG/ML IJ SOLN
INTRAMUSCULAR | Status: AC
  Administered 2017-04-15: 0.5 mg via INTRAVENOUS
  Filled 2017-04-15: qty 1

## 2017-04-15 MED ORDER — SIMETHICONE 80 MG PO CHEW
80.0000 mg | CHEWABLE_TABLET | Freq: Three times a day (TID) | ORAL | Status: DC
  Administered 2017-04-15 – 2017-04-17 (×7): 80 mg via ORAL
  Filled 2017-04-15 (×7): qty 1

## 2017-04-15 NOTE — Discharge Summary (Signed)
Obstetrical Discharge Summary  Patient Name: Shirley Vertell NovakCydnee M Ohlson DOB: 03/19/1996 MRN: [id number]  Date of Admission: 04/13/2017 Date of Discharge: 04/15/2017  [facility]  Gestational Age at Delivery: 2432w5d   Antepartum complications:  - Unplanned pregnancy - Hepatitis C - GBS positive - Obesity - Depression  Admitting Diagnosis:  Secondary Diagnosis: Patient Active Problem List   Diagnosis Date Noted  . Labor and delivery indication for care or intervention 04/13/2017  . Gestational hypertension 04/13/2017  . Hepatitis C carrier (HCC) 04/13/2017  . Supervision of high risk pregnancy in third trimester 04/13/2017  . Asthma affecting pregnancy, antepartum 04/13/2017  . Positive GBS test 04/13/2017  . Depression affecting pregnancy 04/13/2017  . High risk teen pregnancy 04/13/2017  . Obesity affecting pregnancy, antepartum 04/13/2017    Augmentation: AROM Complications: None Intrapartum complications/course:  - pLTCS. Pressure dressing for persistent leaking - Poor pain control during procedure  Date of Delivery: 04/15/17 Delivered By: Christeen DouglasBethany Beasley  Delivery Type: primary cesarean section, low transverse incision Anesthesia: epidural, IV pain meds, local Placenta: Spontaneous Newborn Data: Live born female "Cooper" Birth Weight: 9 lb 4.2 oz (4200 g) APGAR: 8, 9    Discharge Physical Exam:  BP 135/78 (BP Location: Right Arm)   Pulse 88   Temp 98.3 F (36.8 C) (Oral)   Resp 18   SpO2 99%   General: NAD CV: RRR Pulm: CTABL, nl effort ABD: s/nd/nt, fundus firm and below the umbilicus Lochia: moderate Incision: c/d/i  DVT Evaluation: LE non-ttp, no evidence of DVT on exam.  Hemoglobin  Date Value Ref Range Status  04/13/2017 11.6 (L) 12.0 - 16.0 g/dL Final   HCT  Date Value Ref Range Status  04/13/2017 34.4 (L) 35.0 - 47.0 % Final    Post partum course: Stable  Postpartum Procedures: none Disposition: stable, discharge to  home. Baby Feeding: breastfeeding Baby Disposition: home with mom  Rh Immune globulin given: n/a Rubella vaccine given: n/a Tdap vaccine given in AP or PP setting: AP Flu vaccine given in AP or PP setting: n/a  Contraception: Depo to IUD  Prenatal Labs:   A+ RI VNI panel neg  Plan:  Shirley Cydnee Margaretha GlassingM Kohlmeyer was discharged to home in good condition. [facility] with delivering provider in 2 weeks   Discharge Medications: PNV, Ibuprofen, Percocet  Follow-up Information    Christeen DouglasBeasley, Bethany, MD In 2 weeks.   Specialty:  Obstetrics and Gynecology Why:  For postop check Contact information: [address] [place] [address] [phone]           Signed: Sharee Pimplearon W. Emari Hreha, RN, MSN, CNM, FNP

## 2017-04-15 NOTE — Transfer of Care (Signed)
Immediate Anesthesia Transfer of Care Note  Patient: Shirley Black  Procedure(s) Performed: Procedure(s): CESAREAN SECTION (N/A)  Patient Location: PACU and Mother/Baby  Anesthesia Type:Epidural  Level of Consciousness: awake, alert  and oriented  Airway & Oxygen Therapy: Patient Spontanous Breathing and Patient connected to nasal cannula oxygen  Post-op Assessment: Report given to RN and Post -op Vital signs reviewed and stable  Post vital signs: Reviewed and stable  Last Vitals:  Vitals:   04/14/17 2350 04/15/17 0145  BP: 112/70 135/78  Pulse: (!) 112 88  Resp:  18  Temp:      Last Pain:  Vitals:   04/14/17 1747  TempSrc: Oral  PainSc:          Complications: No apparent anesthesia complications

## 2017-04-15 NOTE — Op Note (Addendum)
Cesarean Section Procedure Note  Date of procedure: 04/15/2017   Pre-operative Diagnosis: Intrauterine pregnancy at [redacted]w[redacted]d;  - Arrest of dilation at 5cm - Hepatitis C dx in pregnancy  Post-operative Diagnosis: same, delivered.  Procedure: Primary Low Transverse Cesarean Section through Pfannenstiel incision  Surgeon: Christeen Douglas, MD  Assistant(s):  CST  Anesthesia: Local anesthesia bupivicaine- liposomal- and Epidural anesthesia and iv anesthesia  Anesthesiologist: Yevette Edwards, MD Anesthesiologist: Yevette Edwards, MD; Lenard Simmer, MD CRNA: Omer Jack, CRNA  Estimated Blood Loss:          Drains: none         Total IV Fluids: 700 of LR and 800 of pitocin/LR ml  Urine Output: 500 ml         Specimens: none         Complications:   - Epidural anesthesia appeared to stop working during the closure of the hysterotomy, and patient was actively moving with significant pain and loudly vocalizing - During hysterotomy, despite careful layer by layer incisions, a 2cm superficial laceration was made over the baby's left eyebrow. This did not come in obvious contact with the maternal blood. The membranes were still intact at the beginning of the case, but were not present during the hysterotomy.         Disposition: PACU - hemodynamically stable.         Condition: stable  Findings:  A female infant in cephalic OP presentation. Amniotic fluid - None  Birth weight 4200 g.  Apgars of 8 and 9 at one and five minutes respectively.  Intact placenta with a three-vessel cord.  Grossly normal uterus, tubes and ovaries bilaterally. No intraabdominal adhesions were noted. - Methergine IM given into myometrium for persistently boggy fundus after closure of the hysterotomy.   Indications: failure to progress: arrest of dilation  Procedure Details  The patient was taken to Operating Room, identified as the correct patient and the procedure verified as C-Section Delivery. A  formal Time Out was held with all team members present and in agreement.  After induction of anesthesia, the patient was draped and prepped in the usual sterile manner. A Pfannenstiel skin incision was made and carried down through the subcutaneous tissue to the fascia. Fascial incision was made and extended transversely with the Mayo scissors. The fascia was separated from the underlying rectus tissue superiorly and inferiorly. The peritoneum was identified and entered bluntly. Peritoneal incision was extended longitudinally. The utero-vesical peritoneal reflection was incised transversely and a bladder flap was created digitally.   A low transverse hysterotomy was made. The fetus was delivered atraumatically. The umbilical cord was clamped x2 after 60 sec and cut and the infant was handed to the awaiting pediatricians. The placenta was removed intact and appeared normal, intact, and with a 3-vessel cord.   The uterus was exteriorized and cleared of all clot and debris. The hysterotomy was closed with running sutures of 0-Vicryl. A second imbricating layer was placed with the same suture. She began to respond to touch and individual suture throws at this time. Excellent hemostasis was observed. The peritoneal cavity was cleared of all clots and debris. The uterus was returned to the abdomen.   Excellent hemostasis was noted. The fascia was then reapproximated with running sutures of 0 Vicryl. Her bladder continued to extrude as patient was valsalvaing strongly in reaction to pain.  At this point in the procedure, she received versed, fentanyl, ketamine - please see anesthesia record. She did not receive  apparent pain relief at this point.  The subcutaneous tissue was reapproximated with running sutures of 0 Vicry. The skin was reapproximated with insorb staples.  20ml (in 30 of 0.5% bupivicaine and 50ml of NSS) of liposomal bupivicaine placed in the fascial and skin lines. Pressure dressing placed on  wound due to leaking through the fascial line with the Exparel.  Instrument, sponge, and needle counts were correct prior to the abdominal closure and at the conclusion of the case.   The patient tolerated the procedure well and was transferred to the recovery room in stable condition.   Christeen DouglasBethany Leathia Farnell, MD 04/15/2017

## 2017-04-15 NOTE — Anesthesia Post-op Follow-up Note (Cosign Needed)
Anesthesia QCDR form completed.        

## 2017-04-16 LAB — CBC
HCT: 27 % — ABNORMAL LOW (ref 35.0–47.0)
Hemoglobin: 9.1 g/dL — ABNORMAL LOW (ref 12.0–16.0)
MCH: 29.8 pg (ref 26.0–34.0)
MCHC: 33.5 g/dL (ref 32.0–36.0)
MCV: 89.1 fL (ref 80.0–100.0)
PLATELETS: 195 10*3/uL (ref 150–440)
RBC: 3.03 MIL/uL — ABNORMAL LOW (ref 3.80–5.20)
RDW: 14.6 % — AB (ref 11.5–14.5)
WBC: 14.1 10*3/uL — AB (ref 3.6–11.0)

## 2017-04-16 NOTE — Lactation Note (Signed)
This note was copied from a baby's chart. Lactation Consultation Note  Patient Name: Boy Laverle Hobbyutumn Kane Today's Date: 04/16/2017     Maternal Data    Feeding    LATCH Score/Interventions                      Lactation Tools Discussed/Used     Consult Status  Baby was very spitty and saw baby had been deep suctioned 2xs. Mom has been bottle feeding baby q3hrs 20mL since 715am. Baby not showing signs of hunger. Mom is pumping to maintain supply.     Burnadette PeterJaniya M Vianne Grieshop 04/16/2017, 2:24 PM

## 2017-04-16 NOTE — Anesthesia Postprocedure Evaluation (Signed)
Anesthesia Post Note  Patient: Shirley Black  Procedure(s) Performed: Procedure(s) (LRB): CESAREAN SECTION (N/Black)  Patient location during evaluation: Mother Baby Anesthesia Type: Epidural Level of consciousness: awake and alert Pain management: pain level controlled Vital Signs Assessment: post-procedure vital signs reviewed and stable Respiratory status: spontaneous breathing, nonlabored ventilation and respiratory function stable Cardiovascular status: stable Postop Assessment: no headache, no backache and epidural receding Anesthetic complications: no     Last Vitals:  Vitals:   04/16/17 0003 04/16/17 0515  BP: 132/65   Pulse: 93   Resp: 18   Temp: 36.9 C 36.4 C    Last Pain:  Vitals:   04/16/17 0515  TempSrc: Oral  PainSc:                  Shirley Black,  Shirley Black

## 2017-04-16 NOTE — Lactation Note (Signed)
This note was copied from a baby's chart. Lactation Consultation Note  Patient Name: Shirley Laverle Hobbyutumn Sako UJWJX'BToday's Date: 04/16/2017 Reason for consult: Follow-up assessment   Maternal Data    Feeding Feeding Type: Bottle Fed - Formula Nipple Type: Slow - flow Length of feed: 15 min  LATCH Score/Interventions                      Lactation Tools Discussed/Used     Consult Status Consult Status: Follow-up Date: 04/17/17 Follow-up type: In-patient Mom stated that she didn't want to pursue breastfeeding at this time, "maybe later"   Shirley Black 04/16/2017, 5:24 PM

## 2017-04-16 NOTE — Progress Notes (Signed)
Subjective: Postpartum Day 1: Cesarean Delivery Patient reports tolerating PO and + flatus. No problems voiding. Pain is minimal   Objective: Vital signs in last 24 hours: Temp:  [97.6 F (36.4 C)-99.2 F (37.3 C)] 98.5 F (36.9 C) (07/03 1613) Pulse Rate:  [83-105] 83 (07/03 1613) Resp:  [14-18] 14 (07/03 1613) BP: (105-143)/(59-83) 114/59 (07/03 1613) SpO2:  [95 %-98 %] 97 % (07/03 1613)  Physical Exam:  General: alert, cooperative and appears stated age Lochia: appropriate Uterine Fundus: firm Incision: healing well, no significant drainage on dressings DVT Evaluation: No evidence of DVT seen on physical exam.   Recent Labs  04/16/17 0617  HGB 9.1*  HCT 27.0*    Assessment/Plan: Status post Cesarean section. Doing well postoperatively.  Continue current care.  Christeen DouglasBethany Kymiah Araiza 04/16/2017, 7:39 PM

## 2017-04-17 ENCOUNTER — Encounter: Payer: Self-pay | Admitting: Emergency Medicine

## 2017-04-17 MED ORDER — VARICELLA VIRUS VACCINE LIVE 1350 PFU/0.5ML IJ SUSR
0.5000 mL | Freq: Once | INTRAMUSCULAR | Status: AC
  Administered 2017-04-17: 0.5 mL via SUBCUTANEOUS
  Filled 2017-04-17: qty 0.5

## 2017-04-17 MED ORDER — OXYCODONE-ACETAMINOPHEN 5-325 MG PO TABS: 1.0000 | ORAL_TABLET | ORAL | 0 refills | Status: DC | PRN

## 2017-04-17 MED ORDER — MEDROXYPROGESTERONE ACETATE 150 MG/ML IM SUSP
150.0000 mg | Freq: Once | INTRAMUSCULAR | Status: AC
  Filled 2017-04-17: qty 1

## 2017-04-17 MED ORDER — IBUPROFEN 600 MG PO TABS: 600.0000 mg | ORAL_TABLET | Freq: Four times a day (QID) | ORAL | 0 refills | Status: DC

## 2017-04-17 NOTE — Progress Notes (Signed)
  Subjective:   "I want to go home as I am feeling well"  Objective:  Blood pressure 135/66, pulse 86, temperature 97.9 F (36.6 C), temperature source Oral, resp. rate 16, SpO2 99 %.  General: NAD Pulmonary: no increased work of breathing, Lungs CTA bilat, no W/R/R. Abdomen: non-distended, non-tender, fundus firm at level of umbilicus Incision:C/D/I Extremities: no edema, no erythema, no tenderness  Results for orders placed or performed during the hospital encounter of 04/13/17 (from the past 72 hour(s))  CBC     Status: Abnormal   Collection Time: 04/16/17  6:17 AM  Result Value Ref Range   WBC 14.1 (H) 3.6 - 11.0 K/uL   RBC 3.03 (L) 3.80 - 5.20 MIL/uL   Hemoglobin 9.1 (L) 12.0 - 16.0 g/dL   HCT 29.527.0 (L) 62.135.0 - 30.847.0 %   MCV 89.1 80.0 - 100.0 fL   MCH 29.8 26.0 - 34.0 pg   MCHC 33.5 32.0 - 36.0 g/dL   RDW 65.714.6 (H) 84.611.5 - 96.214.5 %   Platelets 195 150 - 440 K/uL     Assessment:   21 y.o. G1P0 postoperativeday # 2 stable LTCS, ambulating, eating and urinating well   Plan:  1) Acute blood loss anemia - hemodynamically stable and asymptomatic - po ferrous sulfate  2) --/--/A POS (06/30 1802) /   / Varicella non-immune  3) TDAP status given at 28 weeks 4) Breast/Bottle/Contraception:Depo today and IUD at 12 weeks  5) Disposition DC home

## 2017-04-17 NOTE — Progress Notes (Signed)
Reviewed all patients discharge instructions and handouts regarding postpartum bleeding, incision care, no intercourse for 6 weeks, signs and symptoms of mastitis and postpartum bleu's. Reviewed discharge instructions for newborn regarding proper cord care, how and when to bathe the newborn, nail care, proper way to take the baby's temperature, along with safe sleep. All questions have been answered at this time. Patient discharged via wheelchair with RN.

## 2017-04-17 NOTE — Discharge Instructions (Signed)

## 2017-04-22 ENCOUNTER — Encounter: Payer: Self-pay | Admitting: *Deleted

## 2017-04-22 ENCOUNTER — Emergency Department
Admission: EM | Admit: 2017-04-22 | Discharge: 2017-04-22 | Discharge status: Against Medical Advice | Payer: Medicaid Other | Attending: Emergency Medicine | Admitting: Emergency Medicine

## 2017-04-22 DIAGNOSIS — O900 Disruption of cesarean delivery wound: Secondary | ICD-10-CM | POA: Insufficient documentation

## 2017-04-22 DIAGNOSIS — Z5321 Procedure and treatment not carried out due to patient leaving prior to being seen by health care provider: Secondary | ICD-10-CM | POA: Insufficient documentation

## 2017-04-22 LAB — BASIC METABOLIC PANEL
Anion gap: 9 (ref 5–15)
BUN: 14 mg/dL (ref 6–20)
CHLORIDE: 106 mmol/L (ref 101–111)
CO2: 24 mmol/L (ref 22–32)
Calcium: 9 mg/dL (ref 8.9–10.3)
Creatinine, Ser: 0.78 mg/dL (ref 0.44–1.00)
GFR calc non Af Amer: >60 mL/min (ref >60)
Glucose, Bld: 103 mg/dL — ABNORMAL HIGH (ref 65–99)
POTASSIUM: 3.9 mmol/L (ref 3.5–5.1)
SODIUM: 139 mmol/L (ref 135–145)

## 2017-04-22 LAB — CBC
HEMATOCRIT: 28.8 % — AB (ref 35.0–47.0)
Hemoglobin: 9.9 g/dL — ABNORMAL LOW (ref 12.0–16.0)
MCH: 30.5 pg (ref 26.0–34.0)
MCHC: 34.5 g/dL (ref 32.0–36.0)
MCV: 88.5 fL (ref 80.0–100.0)
PLATELETS: 413 10*3/uL (ref 150–440)
RBC: 3.26 MIL/uL — AB (ref 3.80–5.20)
RDW: 14.3 % (ref 11.5–14.5)
WBC: 8.6 10*3/uL (ref 3.6–11.0)

## 2017-04-22 NOTE — ED Triage Notes (Signed)
Pt ambulatory  to triage.  Pt had c-section 1 week ago by dr Dalbert Garnetbeasley. For past 2 days, drainage from incision.  No bleeding. Pt has increased pain at incision.  Pt alert.

## 2017-04-25 ENCOUNTER — Inpatient Hospital Stay
Admission: RE | Admit: 2017-04-25 | Discharge: 2017-04-25 | Disposition: A | Discharge status: Home / Self Care | Payer: Medicaid Other | Source: Ambulatory Visit

## 2017-04-25 NOTE — Consult Note (Signed)
PATIENT NO SHOW FOR ANESTHESIA CONSULT FOR NUMBNESS IN LEG POSTPARTUM. SPOKE WITH PATIENT WHO STATES NUMBNESS IS GOING AWAY. INSTRUCTED PATIENT, AFTER DISCUSSING WITH DR Karlton LemonKARENZ, IF CONTINUES TO GET BETTER SHOULD RESOLVE. IF GETS WORSE AGAIN OR ANY OTHER PROBLEMS TO NOTIFY OB DOCTOR TO RESCHEDULE APPOINTMENT

## 2017-05-14 ENCOUNTER — Encounter: Payer: Self-pay | Admitting: Emergency Medicine

## 2017-05-14 ENCOUNTER — Emergency Department
Admission: EM | Admit: 2017-05-14 | Discharge: 2017-05-14 | Disposition: A | Discharge status: Home / Self Care | Payer: Medicaid Other | Attending: Emergency Medicine | Admitting: Emergency Medicine

## 2017-05-14 DIAGNOSIS — Z5321 Procedure and treatment not carried out due to patient leaving prior to being seen by health care provider: Secondary | ICD-10-CM | POA: Insufficient documentation

## 2017-05-14 DIAGNOSIS — R109 Unspecified abdominal pain: Secondary | ICD-10-CM | POA: Insufficient documentation

## 2017-05-14 DIAGNOSIS — M549 Dorsalgia, unspecified: Secondary | ICD-10-CM | POA: Diagnosis not present

## 2017-05-14 LAB — COMPREHENSIVE METABOLIC PANEL
ALBUMIN: 4.4 g/dL (ref 3.5–5.0)
ALK PHOS: 174 U/L — AB (ref 38–126)
ALT: 43 U/L (ref 14–54)
AST: 44 U/L — AB (ref 15–41)
Anion gap: 10 (ref 5–15)
BILIRUBIN TOTAL: 0.5 mg/dL (ref 0.3–1.2)
BUN: 16 mg/dL (ref 6–20)
CALCIUM: 9.6 mg/dL (ref 8.9–10.3)
CO2: 23 mmol/L (ref 22–32)
CREATININE: 0.77 mg/dL (ref 0.44–1.00)
Chloride: 108 mmol/L (ref 101–111)
GFR calc Af Amer: >60 mL/min (ref >60)
GLUCOSE: 98 mg/dL (ref 65–99)
Potassium: 4.3 mmol/L (ref 3.5–5.1)
Sodium: 141 mmol/L (ref 135–145)
TOTAL PROTEIN: 8.2 g/dL — AB (ref 6.5–8.1)

## 2017-05-14 LAB — URINALYSIS, COMPLETE (UACMP) WITH MICROSCOPIC
Bacteria, UA: NONE SEEN
Bilirubin Urine: NEGATIVE
GLUCOSE, UA: NEGATIVE mg/dL
KETONES UR: NEGATIVE mg/dL
NITRITE: NEGATIVE
PROTEIN: NEGATIVE mg/dL
Specific Gravity, Urine: 1.01 (ref 1.005–1.030)
pH: 6 (ref 5.0–8.0)

## 2017-05-14 LAB — CBC
HCT: 34.3 % — ABNORMAL LOW (ref 35.0–47.0)
Hemoglobin: 11.5 g/dL — ABNORMAL LOW (ref 12.0–16.0)
MCH: 29.1 pg (ref 26.0–34.0)
MCHC: 33.6 g/dL (ref 32.0–36.0)
MCV: 86.5 fL (ref 80.0–100.0)
PLATELETS: 268 10*3/uL (ref 150–440)
RBC: 3.97 MIL/uL (ref 3.80–5.20)
RDW: 14.9 % — AB (ref 11.5–14.5)
WBC: 9.1 10*3/uL (ref 3.6–11.0)

## 2017-05-14 LAB — LIPASE, BLOOD: Lipase: 35 U/L (ref 11–51)

## 2017-05-14 NOTE — ED Triage Notes (Signed)
Pt with abd pain with low back pain started last night with some nausea.

## 2017-05-14 NOTE — ED Notes (Signed)
Patient waiting with family.  Labs: CMP, CBC, and U/A reviewed.  Wait explained to patient.

## 2017-05-15 ENCOUNTER — Telehealth: Payer: Self-pay | Admitting: Emergency Medicine

## 2017-05-15 NOTE — Telephone Encounter (Signed)
Attempted to contact pt via phone no answer or ability to leave a message.  This is in reference to LWBS on the 31st

## 2017-08-15 ENCOUNTER — Other Ambulatory Visit: Payer: Self-pay | Admitting: Infectious Diseases

## 2017-08-15 DIAGNOSIS — B182 Chronic viral hepatitis C: Secondary | ICD-10-CM

## 2017-08-20 ENCOUNTER — Ambulatory Visit: Admission: RE | Admit: 2017-08-20 | Payer: Medicaid Other | Source: Ambulatory Visit

## 2017-08-22 ENCOUNTER — Ambulatory Visit
Admission: RE | Admit: 2017-08-22 | Discharge: 2017-08-22 | Disposition: A | Discharge status: Home / Self Care | Payer: Medicaid Other | Source: Ambulatory Visit | Attending: Infectious Diseases | Admitting: Infectious Diseases

## 2017-08-22 DIAGNOSIS — B182 Chronic viral hepatitis C: Secondary | ICD-10-CM | POA: Diagnosis present

## 2019-07-24 ENCOUNTER — Other Ambulatory Visit: Payer: Self-pay

## 2019-07-24 DIAGNOSIS — Z20822 Contact with and (suspected) exposure to covid-19: Secondary | ICD-10-CM

## 2019-07-25 LAB — NOVEL CORONAVIRUS, NAA: SARS-CoV-2, NAA: NOT DETECTED

## 2021-01-16 ENCOUNTER — Ambulatory Visit (INDEPENDENT_AMBULATORY_CARE_PROVIDER_SITE_OTHER): Payer: Medicaid Other | Admitting: Certified Nurse Midwife

## 2021-01-16 ENCOUNTER — Encounter: Payer: Self-pay | Admitting: Certified Nurse Midwife

## 2021-01-16 ENCOUNTER — Other Ambulatory Visit: Payer: Self-pay

## 2021-01-16 VITALS — BP 129/72 | HR 103 | Resp 16 | Ht 65.0 in | Wt 272.8 lb

## 2021-01-16 DIAGNOSIS — Z3201 Encounter for pregnancy test, result positive: Secondary | ICD-10-CM | POA: Diagnosis not present

## 2021-01-16 DIAGNOSIS — N926 Irregular menstruation, unspecified: Secondary | ICD-10-CM | POA: Diagnosis not present

## 2021-01-16 DIAGNOSIS — O210 Mild hyperemesis gravidarum: Secondary | ICD-10-CM

## 2021-01-16 DIAGNOSIS — O34219 Maternal care for unspecified type scar from previous cesarean delivery: Secondary | ICD-10-CM

## 2021-01-16 DIAGNOSIS — N644 Mastodynia: Secondary | ICD-10-CM

## 2021-01-16 DIAGNOSIS — Z6841 Body Mass Index (BMI) 40.0 and over, adult: Secondary | ICD-10-CM

## 2021-01-16 DIAGNOSIS — Z98891 History of uterine scar from previous surgery: Secondary | ICD-10-CM | POA: Diagnosis not present

## 2021-01-16 DIAGNOSIS — F1190 Opioid use, unspecified, uncomplicated: Secondary | ICD-10-CM

## 2021-01-16 LAB — POCT URINE PREGNANCY: Preg Test, Ur: POSITIVE — AB

## 2021-01-16 NOTE — Progress Notes (Signed)
GYN ENCOUNTER NOTE  Subjective:       Shirley Black is a 25 y.o. G2P1 female here for confirmation of pregnancy.   IUD was removed 04/2020.  Reports positive home pregnancy test. Endorses nausea without vomiting and breast tenderness.   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   Denies difficulty breathing or respiratory distress, chest pain, abdominal pain, vaginal bleeding, dysuria, and leg pain.   Notes back pain since epidural placement during childbirth.   History significant for previous cesarean section for failed induction of labor, failure to progress; desires repeat.    Gynecologic History  Patient's last menstrual period was 12/02/2020.   Gestational age: 35 weeks 3 days  Estimated date of birth: 09/08/2021  Contraception: none  Last Pap: due  Obstetric History  OB History  Gravida Para Term Preterm AB Living  2 1 1     1   SAB IAB Ectopic Multiple Live Births          1    # Outcome Date GA Lbr Len/2nd Weight Sex Delivery Anes PTL Lv  2 Current           1 Term 04/15/17 [redacted]w[redacted]d   M CS-LVertical EPI, Other  LIV     Complications: Anesthetic Complications, Failure to Progress in First Stage    Past Medical History:  Diagnosis Date  . Anxiety   . Asthma   . Depression   . Hepatitis C     Past Surgical History:  Procedure Laterality Date  . CESAREAN SECTION N/A 04/14/2017   Procedure: CESAREAN SECTION;  Surgeon: 06/15/2017, MD;  Location: [facility] ORS;  Service: Obstetrics;  Laterality: N/A;  . TONSILECTOMY, ADENOIDECTOMY, BILATERAL MYRINGOTOMY AND TUBES    . TONSILLECTOMY      Current Outpatient Medications on File Prior to Visit  Medication Sig Dispense Refill  . albuterol (PROVENTIL HFA;VENTOLIN HFA) 108 (90 Base) MCG/ACT inhaler Inhale 2 puffs into the lungs every 6 (six) hours as needed for wheezing or shortness of breath. 1 Inhaler 2  . folic acid (FOLVITE) 1 MG tablet Take by mouth.    Christeen Douglas ibuprofen  (ADVIL,MOTRIN) 600 MG tablet Take 1 tablet (600 mg total) by mouth every 6 (six) hours. 30 tablet 0  . lansoprazole (PREVACID) 15 MG capsule Take 15 mg by mouth daily as needed (for acid reflux).    . methadone (DOLOPHINE) 1 MG/1ML solution Take by mouth.    . Prenatal Vit-Fe Fumarate-FA (PRENATAL MULTIVITAMIN) TABS tablet Take 1 tablet by mouth daily at 12 noon.     No current facility-administered medications on file prior to visit.    Allergies  Allergen Reactions  . Latex Hives  . Adhesive [Tape] Rash    Social History   Socioeconomic History  . Marital status: Single    Spouse name: Not on file  . Number of children: Not on file  . Years of education: Not on file  . Highest education level: Not on file  Occupational History  . Not on file  Tobacco Use  . Smoking status: Current Every Day Smoker    Packs/day: 0.25    Types: Cigarettes  . Smokeless tobacco: Never Used  Substance and Sexual Activity  . Alcohol use: No  . Drug use: No  . Sexual activity: Yes    Birth control/protection: None  Other Topics Concern  . Not on file  Social History Narrative  . Not on file   Social Determinants of Health  Financial Resource Strain: Not on file  Food Insecurity: Not on file  Transportation Needs: Not on file  Physical Activity: Not on file  Stress: Not on file  Social Connections: Not on file  Intimate Partner Violence: Not on file    Family History  Problem Relation Age of Onset  . Depression Mother   . Diabetes Mother   . Asthma Father   . Depression Father   . Depression Sister   . Cancer Maternal Grandmother   . Cancer Paternal Grandmother     The following portions of the patient's history were reviewed and updated as appropriate: allergies, current medications, past family history, past medical history, past social history, past surgical history and problem list.  Review of Systems  ROS negative except as noted above. Information obtained from  patient.   Objective:   BP 129/72   Pulse (!) 103   Resp 16   Ht 5\' 5"  (1.651 m)   Wt 272 lb 12.8 oz (123.7 kg)   BMI 45.40 kg/m   CONSTITUTIONAL: Well-developed, well-nourished female in no acute distress.   PHYSICAL EXAM: Not indicated.   Recent Results (from the past 2160 hour(s))  POCT urine pregnancy     Status: Abnormal   Collection Time: 01/16/21 10:27 AM  Result Value Ref Range   Preg Test, Ur Positive (A) Negative    Assessment:   1. Positive urine pregnancy test  - POCT urine pregnancy - 03/18/21 OB LESS THAN 14 WEEKS WITH OB TRANSVAGINAL; Future  2. Missed menses  - US OB LESS THAN 14 WEEKS WITH OB TRANSVAGINAL; Future  3. History of cesarean section  - US OB LESS THAN 14 WEEKS WITH OB TRANSVAGINAL; Future  4. Declines vaginal birth after cesarean trial   5. Methadone use   6. BMI 45.0-49.9, adult (HCC)  - [birth year]09[birth year]1984 OB LESS THAN 14 WEEKS WITH OB TRANSVAGINAL; Future  7. Morning sickness   8. Breast tenderness   Plan:   First trimester education provided; see AVS.   Samples of prenatal vitamins given.   Advised MD care with this pregnancy due to history and current co-morbidites, patient verbalized understanding.   Reviewed red flag symptoms and when to call.   RTC x 2-3 weeks for dating/viabiity ultrasound and intake.   RTC x 6 weeks for NOB PE with MD or sooner if needed.    [place], CNM [facility] 01/16/21 12:06 PM    I spent 20 minutes dedicated to the care of this patient on the date of this encounter to include pre-visit review of records, face to face time with the patient, and ordering of testing.

## 2021-01-16 NOTE — Patient Instructions (Signed)
Healthy Weight Gain During Pregnancy A certain amount of weight gain during pregnancy is normal and healthy. The amount of weight you should gain during pregnancy depends on your overall health and your weight before you became pregnant. Talk with your health care provider to find out how much weight you should gain during your pregnancy. General guidelines for a healthy total weight gain during pregnancy are based on your body mass index (BMI) and are listed below. If your BMI at or before the start of your pregnancy is:  Less than 18.5 (underweight), you should gain 28-40 lb (13-18 kg).  18.5-24.9 (normal weight), you should gain 25-35 lb (11-16 kg).  25-29.9 (overweight), you should gain 15-25 lb (7-11 kg).  30 or higher (obese), you should gain 11-20 lb (5-9 kg). Your health care provider may recommend that you:  Gain more weight, if you were underweight before pregnancy or if you are pregnant with more than one baby.  Gain less weight, if you were overweight before pregnancy or if you are gaining too much weight during your pregnancy. How does unhealthy weight gain affect me? Gaining too much weight during pregnancy can lead to pregnancy complications, such as:  A temporary form of diabetes that develops during pregnancy (gestational diabetes).  Hypertensive disorders of pregnancy (preeclampsia or gestational hypertension).  Raising your risk of having a more difficult delivery or a surgical delivery (cesarean delivery, or C-section). How does unhealthy weight gain affect my baby? Not gaining enough weight can be life-threatening for your baby. It may also raise these risks for your baby:  Being born early (preterm).  Being smaller than normal during pregnancy or not growing normally (intrauterine growth restriction).  Having a low weight at birth. Gaining too much weight may raise these risks for your baby:  Growing larger than normal during pregnancy (large for gestational  age or macrosomia).  Increased risk of obesity. What actions can I take to gain a healthy amount of weight during pregnancy? Nutrition  Eat healthy foods. Every day, try to eat: ? Fruits and vegetables. Include a variety of colors and types, like sweet potatoes, oranges, apples, bell peppers, beets, berries, squash, and broccoli. ? Whole grains, such as millet, barley, whole-wheat breads and cereals, and oatmeal. ? Low-fat dairy products, like yogurt, milk, and cheese. You can also include non-dairy choices, like almond milk or rice milk. ? Protein-rich foods, like lean meat, chicken, eggs, peas, and beans.  Avoid foods that are fried or have a lot of fat, salt (sodium), or sugar.  Drink enough fluid to keep your urine pale yellow.  Choose healthy snack and drink options when you are away from home: ? Drink water. Avoid soda, sports drinks, and juices that have added sugar. ? Avoid drinks with caffeine, such as coffee and energy drinks. ? Eat snacks that are high in protein, such as nuts, protein bars, and low-fat yogurt. ? Carry convenient snacks with you that do not need refrigeration, such as a pack of trail mix, an apple, or a granola bar.  If you need help improving your diet, work with a health care provider or a dietitian.   Activity  Exercise regularly, as told by your health care provider. ? If you were active before becoming pregnant, you may be able to continue your regular fitness activities. ? If you were not active before pregnancy, you may gradually build up to exercising for 30 or more minutes on most days of the week. This may include walking, swimming,   or yoga.  Ask your health care provider what activities are safe for you. Talk with your health care provider about whether you may need to be excused from certain school or work activities.   Follow these instructions at home:  Take over-the-counter and prescription medicines only as told by your health care  provider.  Take all prenatal supplements as told by your health care provider.  Keep track of your weight gain during pregnancy.  Keep all health care visits during pregnancy (prenatal visits). These visits are a good time to discuss your weight gain. Your health care provider will weigh you at each visit to make sure you are gaining a healthy amount of weight. Where to find support Some pregnant women and teens face unique challenges and need extra support. If you have questions or need help gaining a healthy amount of weight during pregnancy, these people may help:  Your health care provider.  A dietitian.  Your school nurse.  Your family and friends.  Local counseling centers, church groups, or clinics that have services for teens. Where to find more information Learn more about managing your weight gain during pregnancy from:  American Pregnancy Association: americanpregnancy.org  U.S. Department of Agriculture pregnancy weight gain calculator: myplate.gov Contact a health care provider if:  You are unable to eat or drink for longer than 24 hours.  You cannot afford food or have trouble accessing regular meals. Get help right away if you: Summary  Talk with your health care provider to find out how much weight you should gain during your pregnancy.  Too much or too little weight gain during pregnancy can lead to complications for you and your baby.  Eat healthy foods like fruits and vegetables, whole grains, low-fat dairy products, and protein-rich foods.  Ask your health care provider what activities are safe for you.  Keep all of your prenatal visits. This information is not intended to replace advice given to you by your health care provider. Make sure you discuss any questions you have with your health care provider. Document Revised: 04/28/2020 Document Reviewed: 04/28/2020 Elsevier Patient Education  2021 Elsevier Inc.    Common Medications Safe in  Pregnancy  Acne:      Constipation:  Benzoyl Peroxide     Colace  Clindamycin      Dulcolax Suppository  Topica Erythromycin     Fibercon  Salicylic Acid      Metamucil         Miralax AVOID:        Senakot   Accutane    Cough:  Retin-A       Cough Drops  Tetracycline      Phenergan w/ Codeine if Rx  Minocycline      Robitussin (Plain & DM)  Antibiotics:     Crabs/Lice:  Ceclor       RID  Cephalosporins    AVOID:  E-Mycins      Kwell  Keflex  Macrobid/Macrodantin   Diarrhea:  Penicillin      Kao-Pectate  Zithromax      Imodium AD         PUSH FLUIDS AVOID:       Cipro     Fever:  Tetracycline      Tylenol (Regular or Extra  Minocycline       Strength)  Levaquin      Extra Strength-Do not          Exceed 8 tabs/24 hrs Caffeine:        <  200mg/day (equiv. To 1 cup of coffee or  approx. 3 12 oz sodas)         Gas: Cold/Hayfever:       Gas-X  Benadryl      Mylicon  Claritin       Phazyme  **Claritin-D        Chlor-Trimeton    Headaches:  Dimetapp      ASA-Free Excedrin  Drixoral-Non-Drowsy     Cold Compress  Mucinex (Guaifenasin)     Tylenol (Regular or Extra  Sudafed/Sudafed-12 Hour     Strength)  **Sudafed PE Pseudoephedrine   Tylenol Cold & Sinus     Vicks Vapor Rub  Zyrtec  **AVOID if Problems With Blood Pressure         Heartburn: Avoid lying down for at least 1 hour after meals  Aciphex      Maalox     Rash:  Milk of Magnesia     Benadryl    Mylanta       1% Hydrocortisone Cream  Pepcid  Pepcid Complete   Sleep Aids:  Prevacid      Ambien   Prilosec       Benadryl  Rolaids       Chamomile Tea  Tums (Limit 4/day)     Unisom         Tylenol PM         Warm milk-add vanilla or  Hemorrhoids:       Sugar for taste  Anusol/Anusol H.C.  (RX: Analapram 2.5%)  Sugar Substitutes:  Hydrocortisone OTC     Ok in moderation  Preparation H      Tucks        Vaseline lotion applied to tissue with  wiping    Herpes:     Throat:  Acyclovir      Oragel  Famvir  Valtrex     Vaccines:         Flu Shot Leg Cramps:       *Gardasil  Benadryl      Hepatitis A         Hepatitis B Nasal Spray:       Pneumovax  Saline Nasal Spray     Polio Booster         Tetanus Nausea:       Tuberculosis test or PPD  Vitamin B6 25 mg TID   AVOID:    Dramamine      *Gardasil  Emetrol       Live Poliovirus  Ginger Root 250 mg QID    MMR (measles, mumps &  High Complex Carbs @ Bedtime    rebella)  Sea Bands-Accupressure    Varicella (Chickenpox)  Unisom 1/2 tab TID     *No known complications           If received before Pain:         Known pregnancy;   Darvocet       Resume series after  Lortab        Delivery  Percocet    Yeast:   Tramadol      Femstat  Tylenol 3      Gyne-lotrimin  Ultram       Monistat  Vicodin           MISC:         All Sunscreens           Hair Coloring/highlights          Insect   Repellant's          (Including DEET)         Mystic Tans    [email or web address]">  First Trimester of Pregnancy  The first trimester of pregnancy starts on the first day of your last menstrual period until the end of week 12. This is also called months 1 through 3 of pregnancy. Body changes during your first trimester Your body goes through many changes during pregnancy. The changes usually return to normal after your baby is born. Physical changes  You may gain or lose weight.  Your breasts may grow larger and hurt. The area around your nipples may get darker.  Dark spots or blotches may develop on your face.  You may have changes in your hair. Health changes  You may feel like you might vomit (nauseous), and you may vomit.  You may have heartburn.  You may have headaches.  You may have trouble pooping (constipation).  Your gums may bleed. Other changes  You may get tired easily.  You may pee (urinate) more often.  Your menstrual periods will  stop.  You may not feel hungry.  You may want to eat certain kinds of food.  You may have changes in your emotions from day to day.  You may have more dreams. Follow these instructions at home: Medicines  Take over-the-counter and prescription medicines only as told by your doctor. Some medicines are not safe during pregnancy.  Take a prenatal vitamin that contains at least 600 micrograms (mcg) of folic acid. Eating and drinking  Eat healthy meals that include: ? Fresh fruits and vegetables. ? Whole grains. ? Good sources of protein, such as meat, eggs, or tofu. ? Low-fat dairy products.  Avoid raw meat and unpasteurized juice, milk, and cheese.  If you feel like you may vomit, or you vomit: ? Eat 4 or 5 small meals a day instead of 3 large meals. ? Try eating a few soda crackers. ? Drink liquids between meals instead of during meals.  You may need to take these actions to prevent or treat trouble pooping: ? Drink enough fluids to keep your pee (urine) pale yellow. ? Eat foods that are high in fiber. These include beans, whole grains, and fresh fruits and vegetables. ? Limit foods that are high in fat and sugar. These include fried or sweet foods. Activity  Exercise only as told by your doctor. Most people can do their usual exercise routine during pregnancy.  Stop exercising if you have cramps or pain in your lower belly (abdomen) or low back.  Do not exercise if it is too hot or too humid, or if you are in a place of great height (high altitude).  Avoid heavy lifting.  If you choose to, you may have sex unless your doctor tells you not to. Relieving pain and discomfort  Wear a good support bra if your breasts are sore.  Rest with your legs raised (elevated) if you have leg cramps or low back pain.  If you have bulging veins (varicose veins) in your legs: ? Wear support hose as told by your doctor. ? Raise your feet for 15 minutes, 3-4 times a day. ? Limit salt  in your food. Safety  Wear your seat belt at all times when you are in a car.  Talk with your doctor if someone is hurting you or yelling at you.  Talk with your doctor if you are feeling sad or have thoughts of hurting yourself.  Lifestyle  Do not use hot tubs, steam rooms, or saunas.  Do not douche. Do not use tampons or scented sanitary pads.  Do not use herbal medicines, illegal drugs, or medicines that are not approved by your doctor. Do not drink alcohol.  Do not smoke or use any products that contain nicotine or tobacco. If you need help quitting, ask your doctor.  Avoid cat litter boxes and soil that is used by cats. These carry germs that can cause harm to the baby and can cause a loss of your baby by miscarriage or stillbirth. General instructions  Keep all follow-up visits. This is important.  Ask for help if you need counseling or if you need help with nutrition. Your doctor can give you advice or tell you where to go for help.  Visit your dentist. At home, brush your teeth with a soft toothbrush. Floss gently.  Write down your questions. Take them to your prenatal visits. Where to find more information  American Pregnancy Association: americanpregnancy.org  [employer]: www.acog.org  Office on Women's Health: KeywordPortfolios.com.br Contact a doctor if:  You are dizzy.  You have a fever.  You have mild cramps or pressure in your lower belly.  You have a nagging pain in your belly area.  You continue to feel like you may vomit, you vomit, or you have watery poop (diarrhea) for 24 hours or longer.  You have a bad-smelling fluid coming from your vagina.  You have pain when you pee.  You are exposed to a disease that spreads from person to person, such as chickenpox, measles, Zika virus, HIV, or hepatitis. Get help right away if:  You have spotting or bleeding from your vagina.  You have very bad belly cramping  or pain.  You have shortness of breath or chest pain.  You have any kind of injury, such as from a fall or a car crash.  You have new or increased pain, swelling, or redness in an arm or leg. Summary  The first trimester of pregnancy starts on the first day of your last menstrual period until the end of week 12 (months 1 through 3).  Eat 4 or 5 small meals a day instead of 3 large meals.  Do not smoke or use any products that contain nicotine or tobacco. If you need help quitting, ask your doctor.  Keep all follow-up visits. This information is not intended to replace advice given to you by your health care provider. Make sure you discuss any questions you have with your health care provider. Document Revised: 03/09/2020 Document Reviewed: 01/14/2020 Elsevier Patient Education  2021 [employer].

## 2021-02-03 ENCOUNTER — Telehealth: Payer: Self-pay | Admitting: Obstetrics and Gynecology

## 2021-02-03 NOTE — Telephone Encounter (Signed)
Error

## 2021-02-03 NOTE — Telephone Encounter (Signed)
Spoke with patient and she has been seen at [facility] last. I ask if she was being seen by [place] or [place]. Patient stated that she would rather be seen by [place].  Patient has been having back pain. I advised to take tylenol for the pain. Patient has been doing this without much relief. I tole her that she could alternate between heat and ice. She can also google exercises to help with the back pain. Patient stated that she would try this. Patient has also been having some nausea. [place] prescribed phenergan. 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 Patient will try B6 and unisom to see if this helps.

## 2021-02-03 NOTE — Telephone Encounter (Signed)
New Message:  Pt called and stated that she is in her first trimester.  She states that her lower back has been hurting her and she cant sleep at night.  She has taken tylenol with no relief.  She stated that she has also had some nausea and went to [facility] and was given a RX that she cant remember the name and she doesn't think she can take and she stated that she told them she was pregnant.

## 2021-02-07 ENCOUNTER — Ambulatory Visit (INDEPENDENT_AMBULATORY_CARE_PROVIDER_SITE_OTHER): Payer: Medicaid Other | Admitting: Obstetrics and Gynecology

## 2021-02-07 ENCOUNTER — Other Ambulatory Visit: Payer: Self-pay

## 2021-02-07 ENCOUNTER — Encounter: Payer: Self-pay | Admitting: Obstetrics and Gynecology

## 2021-02-07 VITALS — BP 109/77 | HR 88 | Wt 267.7 lb

## 2021-02-07 DIAGNOSIS — Z3A09 9 weeks gestation of pregnancy: Secondary | ICD-10-CM

## 2021-02-07 DIAGNOSIS — Z7689 Persons encountering health services in other specified circumstances: Secondary | ICD-10-CM | POA: Diagnosis not present

## 2021-02-07 LAB — POCT URINALYSIS DIPSTICK OB
Bilirubin, UA: NEGATIVE
Blood, UA: NEGATIVE
Glucose, UA: NEGATIVE
Ketones, UA: NEGATIVE
Leukocytes, UA: NEGATIVE
Nitrite, UA: NEGATIVE
Spec Grav, UA: 1.025 (ref 1.010–1.025)
Urobilinogen, UA: 0.2 E.U./dL
pH, UA: 6 (ref 5.0–8.0)

## 2021-02-07 NOTE — Patient Instructions (Signed)

## 2021-02-07 NOTE — Progress Notes (Signed)
OB-Pt present for prenatal care. Pt c/o extreme back pain and upper abd pain.

## 2021-02-07 NOTE — Progress Notes (Signed)
HPI:      [name] is a 25 y.o. G2P1001 who LMP was Patient's last menstrual period was 12/02/2020.  Subjective:   She presents today to establish care.  She was initially seen at [facility] but was unhappy there and plans to seek prenatal care and delivery with [place]. Based on LMP she is approximately 9 weeks estimated gestational age although she is not exactly sure of her last normal period. She is having some nausea without vomiting. Currently taking prenatal vitamins. Previous pregnancy ended in a cesarean delivery for a macrosomic infant after many hours of labor and no dilation past 5 cm.  She also had general anesthesia for that cesarean.  Patient states that she would like a repeat cesarean delivery. 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    Hx: The following portions of the patient's history were reviewed and updated as appropriate:             She  has a past medical history of Anxiety, Asthma, Depression, and Hepatitis C. She does not have any pertinent problems on file. She  has a past surgical history that includes Tonsillectomy; Tonsilectomy, adenoidectomy, bilateral myringotomy and tubes; and Cesarean section (N/A, 04/14/2017). Her family history includes Asthma in her father; Cancer in her maternal grandmother and paternal grandmother; Depression in her father, mother, and sister; Diabetes in her mother. She  reports that she has been smoking cigarettes. She has been smoking about 0.25 packs per day. She has never used smokeless tobacco. She reports that she does not drink alcohol and does not use drugs. She has a current medication list which includes the following prescription(s): albuterol, folic acid, lansoprazole, methadone, and prenatal multivitamin. She is allergic to latex and adhesive [tape].       Review of Systems:  Review of Systems  Constitutional: Denied constitutional symptoms, night sweats, recent illness, fatigue, fever, insomnia  and weight loss.  Eyes: Denied eye symptoms, eye pain, photophobia, vision change and visual disturbance.  Ears/Nose/Throat/Neck: Denied ear, nose, throat or neck symptoms, hearing loss, nasal discharge, sinus congestion and sore throat.  Cardiovascular: Denied cardiovascular symptoms, arrhythmia, chest pain/pressure, edema, exercise intolerance, orthopnea and palpitations.  Respiratory: Denied pulmonary symptoms, asthma, pleuritic pain, productive sputum, cough, dyspnea and wheezing.  Gastrointestinal: Denied, gastro-esophageal reflux, melena, nausea and vomiting.  Genitourinary: Denied genitourinary symptoms including symptomatic vaginal discharge, pelvic relaxation issues, and urinary complaints.  Musculoskeletal: Denied musculoskeletal symptoms, stiffness, swelling, muscle weakness and myalgia.  Dermatologic: Denied dermatology symptoms, rash and scar.  Neurologic: Denied neurology symptoms, dizziness, headache, neck pain and syncope.  Psychiatric: Denied psychiatric symptoms, anxiety and depression.  Endocrine: Denied endocrine symptoms including hot flashes and night sweats.   Meds:   Current Outpatient Medications on File Prior to Visit  Medication Sig Dispense Refill  . albuterol (PROVENTIL HFA;VENTOLIN HFA) 108 (90 Base) MCG/ACT inhaler Inhale 2 puffs into the lungs every 6 (six) hours as needed for wheezing or shortness of breath. 1 Inhaler 2  . folic acid (FOLVITE) 1 MG tablet Take by mouth.    . lansoprazole (PREVACID) 15 MG capsule Take 15 mg by mouth daily as needed (for acid reflux).    . methadone (DOLOPHINE) 1 MG/1ML solution Take by mouth.    . Prenatal Vit-Fe Fumarate-FA (PRENATAL MULTIVITAMIN) TABS tablet Take 1 tablet by mouth daily at 12 noon.     No current facility-administered medications on file prior to visit.          Objective:  Vitals:   02/07/21 1407  BP: 109/77  Pulse: 88   Filed Weights   02/07/21 1407  Weight: 267 lb 11.2 oz (121.4 kg)                 Assessment:    G2P1001 Patient Active Problem List   Diagnosis Date Noted  . History of gestational hypertension 04/13/2017  . Hepatitis C carrier (HCC) 04/13/2017     1. Encounter to establish care   2. [redacted] weeks gestation of pregnancy        Plan:           Prenatal Plan 1.  The patient was given prenatal literature. 2.  She was continued on prenatal vitamins. 3.  A prenatal lab panel to be drawn at nurse visit. 4.  An ultrasound was ordered to better determine an EDC. 5.  A nurse visit was scheduled. 6.  Genetic testing and testing for other inheritable conditions discussed in detail. She will decide in the future whether to have these labs performed. 7.  A general overview of pregnancy testing, visit schedule, ultrasound schedule, and prenatal care was discussed. 8.  COVID and its risks associated with pregnancy, prevention by limiting exposure and use of masks, as well as the risks and benefits of vaccination during pregnancy were discussed in detail.  [facility] policy regarding office and hospital visitation and testing was explained. 9.  Benefits of breast-feeding discussed in detail including both maternal and infant benefits. Ready Set Baby website discussed. 10.  Literature given for nausea and vomiting of pregnancy 11.  18-week 1 hour GCT for elevated BMI. 12.  Likely will need anesthesia consult during pregnancy for elevated BMI.   Orders Orders Placed This Encounter  Procedures  . POC Urinalysis Dipstick OB    No orders of the defined types were placed in this encounter.     F/U  Return in about 3 weeks (around 02/28/2021). I spent 32 minutes involved in the care of this patient preparing to see the patient by obtaining and reviewing her medical history (including labs, imaging tests and prior procedures), documenting clinical information in the electronic health record (EHR), counseling and coordinating care plans, writing and sending  prescriptions, ordering tests or procedures and directly communicating with the patient by discussing pertinent items from her history and physical exam as well as detailing my assessment and plan as noted above so that she has an informed understanding.  All of her questions were answered.  Elonda Husky, M.D. 02/07/2021 2:49 PM

## 2021-02-16 ENCOUNTER — Telehealth: Payer: Self-pay | Admitting: Obstetrics and Gynecology

## 2021-02-16 NOTE — Telephone Encounter (Signed)
I let the patient know that per Dr. Logan Bores paper that he typed up you should not take more than 100 mg B6 a day. Patient verbalized understanding.

## 2021-02-16 NOTE — Telephone Encounter (Signed)
At [facility]  gave her B6 for nausea - 6 a day: 2 am, 2 noon, 2 night- pt is wondering if this is correct dosage- and if she need to continue. Please Advise.

## 2021-02-24 ENCOUNTER — Other Ambulatory Visit: Payer: Self-pay | Admitting: Certified Nurse Midwife

## 2021-02-24 ENCOUNTER — Ambulatory Visit
Admission: RE | Admit: 2021-02-24 | Discharge: 2021-02-24 | Disposition: A | Discharge status: Home / Self Care | Payer: Medicaid Other | Source: Ambulatory Visit | Attending: Certified Nurse Midwife | Admitting: Certified Nurse Midwife

## 2021-02-24 ENCOUNTER — Other Ambulatory Visit: Payer: Self-pay

## 2021-02-24 DIAGNOSIS — Z6841 Body Mass Index (BMI) 40.0 and over, adult: Secondary | ICD-10-CM | POA: Insufficient documentation

## 2021-02-24 DIAGNOSIS — Z3201 Encounter for pregnancy test, result positive: Secondary | ICD-10-CM

## 2021-02-24 DIAGNOSIS — N926 Irregular menstruation, unspecified: Secondary | ICD-10-CM

## 2021-02-24 DIAGNOSIS — Z98891 History of uterine scar from previous surgery: Secondary | ICD-10-CM | POA: Insufficient documentation

## 2021-03-01 ENCOUNTER — Encounter: Payer: Medicaid Other | Admitting: Obstetrics and Gynecology

## 2021-03-02 ENCOUNTER — Telehealth: Payer: Self-pay

## 2021-03-02 NOTE — Telephone Encounter (Signed)
Called pt to change her Monday appt to an earlier appt. Pt was able to change her appt.

## 2021-03-14 ENCOUNTER — Other Ambulatory Visit: Payer: Self-pay

## 2021-03-14 ENCOUNTER — Other Ambulatory Visit (HOSPITAL_COMMUNITY)
Admission: RE | Admit: 2021-03-14 | Discharge: 2021-03-14 | Disposition: A | Discharge status: Home / Self Care | Payer: Medicaid Other | Source: Ambulatory Visit | Attending: Obstetrics and Gynecology | Admitting: Obstetrics and Gynecology

## 2021-03-14 ENCOUNTER — Encounter: Payer: Self-pay | Admitting: Obstetrics and Gynecology

## 2021-03-14 ENCOUNTER — Ambulatory Visit (INDEPENDENT_AMBULATORY_CARE_PROVIDER_SITE_OTHER): Payer: Medicaid Other | Admitting: Obstetrics and Gynecology

## 2021-03-14 VITALS — BP 132/82 | HR 80 | Wt 258.4 lb

## 2021-03-14 DIAGNOSIS — Z3492 Encounter for supervision of normal pregnancy, unspecified, second trimester: Secondary | ICD-10-CM

## 2021-03-14 DIAGNOSIS — F1120 Opioid dependence, uncomplicated: Secondary | ICD-10-CM

## 2021-03-14 DIAGNOSIS — Z124 Encounter for screening for malignant neoplasm of cervix: Secondary | ICD-10-CM | POA: Insufficient documentation

## 2021-03-14 DIAGNOSIS — Z6841 Body Mass Index (BMI) 40.0 and over, adult: Secondary | ICD-10-CM

## 2021-03-14 DIAGNOSIS — Z79891 Long term (current) use of opiate analgesic: Secondary | ICD-10-CM

## 2021-03-14 DIAGNOSIS — Z3A14 14 weeks gestation of pregnancy: Secondary | ICD-10-CM

## 2021-03-14 NOTE — Addendum Note (Signed)
Addended by: Dorian Pod on: 03/14/2021 02:22 PM   Modules accepted: Orders

## 2021-03-14 NOTE — Progress Notes (Signed)
NOB: States that she is feeling better.  Nausea decreased.  Has maintained same methadone dose.  Did not come for nurse visit for blood work (child sick, car trouble) taking prenatal vitamins as directed.  Advised use of 81 mg ASA. Blood work today including MaterniT 21.  aFP next visit  Physical examination General NAD, Conversant  HEENT Atraumatic; Op clear with mmm.  Normo-cephalic. Pupils reactive. Anicteric sclerae  Thyroid/Neck Smooth without nodularity or enlargement. Normal ROM.  Neck Supple.  Skin No rashes, lesions or ulceration. Normal palpated skin turgor. No nodularity.  Breasts: No masses or discharge.  Symmetric.  No axillary adenopathy.  Lungs: Clear to auscultation.No rales or wheezes. Normal Respiratory effort, no retractions.  Heart: NSR.  No murmurs or rubs appreciated. No periferal edema  Abdomen: Soft.  Non-tender.  No masses.  No HSM. No hernia  Extremities: Moves all appropriately.  Normal ROM for age. No lymphadenopathy.  Neuro: Oriented to PPT.  Normal mood. Normal affect.     Pelvic:   Vulva: Normal appearance.  No lesions.  Vagina: No lesions or abnormalities noted.  Support: Normal pelvic support.  Urethra No masses tenderness or scarring.  Meatus Normal size without lesions or prolapse.  Cervix: Normal appearance.  No lesions.  Anus: Normal exam.  No lesions.  Perineum: Normal exam.  No lesions.        Bimanual   Adnexae: No masses.  Non-tender to palpation.  Uterus: Enlarged. Pos FHTs  Non-tender.  Mobile.  AV.  Adnexae: No masses.  Non-tender to palpation.  Cul-de-sac: Negative for abnormality.  Adnexae: No masses.  Non-tender to palpation.         Pelvimetry   Diagonal: Reached.  Spines: Average.  Sacrum: Concave.  Pubic Arch: Normal.

## 2021-03-15 ENCOUNTER — Telehealth: Payer: Self-pay | Admitting: Obstetrics and Gynecology

## 2021-03-15 NOTE — Telephone Encounter (Signed)
Shirley Black called in and states that she was in the shower last night and noticed a large bump in her vagina.  She states it is very sore and tender.  Shirley Black wants to know if this is something she should worry about?  She states as far as she knows, this bump was not present when she came in for her appointment yesterday.  Please advise.

## 2021-03-16 ENCOUNTER — Encounter: Payer: Self-pay | Admitting: Obstetrics and Gynecology

## 2021-03-16 LAB — VIRAL HEPATITIS HBV, HCV
HCV Ab: >11 s/co ratio — ABNORMAL HIGH (ref 0.0–0.9)
Hep B Core Total Ab: NEGATIVE
Hep B Surface Ab, Qual: NONREACTIVE
Hepatitis B Surface Ag: NEGATIVE

## 2021-03-16 LAB — CBC WITH DIFFERENTIAL/PLATELET
Basophils Absolute: 0 10*3/uL (ref 0.0–0.2)
Basos: 0 %
EOS (ABSOLUTE): 0.1 10*3/uL (ref 0.0–0.4)
Eos: 1 %
Hematocrit: 35.7 % (ref 34.0–46.6)
Hemoglobin: 12.2 g/dL (ref 11.1–15.9)
Immature Grans (Abs): 0 10*3/uL (ref 0.0–0.1)
Immature Granulocytes: 0 %
Lymphocytes Absolute: 1.7 10*3/uL (ref 0.7–3.1)
Lymphs: 23 %
MCH: 31.5 pg (ref 26.6–33.0)
MCHC: 34.2 g/dL (ref 31.5–35.7)
MCV: 92 fL (ref 79–97)
Monocytes Absolute: 0.6 10*3/uL (ref 0.1–0.9)
Monocytes: 8 %
Neutrophils Absolute: 5 10*3/uL (ref 1.4–7.0)
Neutrophils: 68 %
Platelets: 186 10*3/uL (ref 150–450)
RBC: 3.87 x10E6/uL (ref 3.77–5.28)
RDW: 13.1 % (ref 11.7–15.4)
WBC: 7.4 10*3/uL (ref 3.4–10.8)

## 2021-03-16 LAB — RUBELLA SCREEN: Rubella Antibodies, IGG: <0.9 index — ABNORMAL LOW (ref >0.99)

## 2021-03-16 LAB — HCV RT-PCR, QUANT (NON-GRAPH): Hepatitis C Quantitation: NOT DETECTED IU/mL

## 2021-03-16 LAB — "ABO AND RH ": Rh Factor: POSITIVE

## 2021-03-16 LAB — VARICELLA ZOSTER ANTIBODY, IGG: Varicella zoster IgG: 255 index (ref >165)

## 2021-03-16 LAB — CYTOLOGY - PAP: Diagnosis: NEGATIVE

## 2021-03-16 LAB — RPR: RPR Ser Ql: NONREACTIVE

## 2021-03-16 LAB — ANTIBODY SCREEN: Antibody Screen: NEGATIVE

## 2021-03-16 LAB — HIV ANTIBODY (ROUTINE TESTING W REFLEX): HIV Screen 4th Generation wRfx: NONREACTIVE

## 2021-03-16 NOTE — Telephone Encounter (Signed)
LM for patient to return call.

## 2021-03-18 LAB — MATERNIT21  PLUS CORE+ESS+SCA, BLOOD

## 2021-03-21 ENCOUNTER — Telehealth: Payer: Self-pay | Admitting: Obstetrics and Gynecology

## 2021-03-21 NOTE — Telephone Encounter (Signed)
Patient is requesting a call back she has questions about her lab results

## 2021-03-22 NOTE — Telephone Encounter (Signed)
Notified patient of lab results 

## 2021-03-22 NOTE — Telephone Encounter (Signed)
LM for patient to return call.

## 2021-03-23 ENCOUNTER — Encounter: Payer: Medicaid Other | Admitting: Obstetrics and Gynecology

## 2021-04-03 NOTE — Patient Instructions (Signed)

## 2021-04-04 ENCOUNTER — Other Ambulatory Visit: Payer: Self-pay

## 2021-04-04 ENCOUNTER — Encounter: Payer: Self-pay | Admitting: Obstetrics and Gynecology

## 2021-04-04 ENCOUNTER — Ambulatory Visit (INDEPENDENT_AMBULATORY_CARE_PROVIDER_SITE_OTHER): Payer: Medicaid Other | Admitting: Obstetrics and Gynecology

## 2021-04-04 VITALS — BP 117/79 | HR 89 | Wt 251.1 lb

## 2021-04-04 DIAGNOSIS — J45909 Unspecified asthma, uncomplicated: Secondary | ICD-10-CM | POA: Diagnosis not present

## 2021-04-04 DIAGNOSIS — O09299 Supervision of pregnancy with other poor reproductive or obstetric history, unspecified trimester: Secondary | ICD-10-CM

## 2021-04-04 DIAGNOSIS — O219 Vomiting of pregnancy, unspecified: Secondary | ICD-10-CM | POA: Diagnosis not present

## 2021-04-04 DIAGNOSIS — O34219 Maternal care for unspecified type scar from previous cesarean delivery: Secondary | ICD-10-CM

## 2021-04-04 DIAGNOSIS — O99320 Drug use complicating pregnancy, unspecified trimester: Secondary | ICD-10-CM

## 2021-04-04 DIAGNOSIS — Z8759 Personal history of other complications of pregnancy, childbirth and the puerperium: Secondary | ICD-10-CM

## 2021-04-04 DIAGNOSIS — Z98891 History of uterine scar from previous surgery: Secondary | ICD-10-CM

## 2021-04-04 DIAGNOSIS — Z3A00 Weeks of gestation of pregnancy not specified: Secondary | ICD-10-CM

## 2021-04-04 DIAGNOSIS — O99519 Diseases of the respiratory system complicating pregnancy, unspecified trimester: Secondary | ICD-10-CM

## 2021-04-04 DIAGNOSIS — F1120 Opioid dependence, uncomplicated: Secondary | ICD-10-CM

## 2021-04-04 DIAGNOSIS — O99210 Obesity complicating pregnancy, unspecified trimester: Secondary | ICD-10-CM

## 2021-04-04 DIAGNOSIS — J4520 Mild intermittent asthma, uncomplicated: Secondary | ICD-10-CM

## 2021-04-04 DIAGNOSIS — E669 Obesity, unspecified: Secondary | ICD-10-CM

## 2021-04-04 DIAGNOSIS — O0992 Supervision of high risk pregnancy, unspecified, second trimester: Secondary | ICD-10-CM

## 2021-04-04 DIAGNOSIS — Z3A17 17 weeks gestation of pregnancy: Secondary | ICD-10-CM

## 2021-04-04 DIAGNOSIS — Z8619 Personal history of other infectious and parasitic diseases: Secondary | ICD-10-CM

## 2021-04-04 DIAGNOSIS — Z1379 Encounter for other screening for genetic and chromosomal anomalies: Secondary | ICD-10-CM

## 2021-04-04 LAB — POCT URINALYSIS DIPSTICK OB
Bilirubin, UA: NEGATIVE
Blood, UA: NEGATIVE
Glucose, UA: NEGATIVE
Ketones, UA: NEGATIVE
Leukocytes, UA: NEGATIVE
Nitrite, UA: NEGATIVE
POC,PROTEIN,UA: NEGATIVE
Spec Grav, UA: <=1.005 — AB (ref 1.010–1.025)
Urobilinogen, UA: 0.2 E.U./dL
pH, UA: 8.5 — AB (ref 5.0–8.0)

## 2021-04-04 MED ORDER — ONDANSETRON 4 MG PO TBDP: 4.0000 mg | ORAL_TABLET | Freq: Three times a day (TID) | ORAL | 1 refills | Status: DC | PRN

## 2021-04-04 MED ORDER — ALBUTEROL SULFATE HFA 108 (90 BASE) MCG/ACT IN AERS: 2.0000 | INHALATION_SPRAY | Freq: Four times a day (QID) | RESPIRATORY_TRACT | 2 refills | Status: DC | PRN

## 2021-04-04 NOTE — Progress Notes (Signed)
OB-pt present for routine prenatal care. Pt c/o pelvic floor pain. Pt rex refill of albuterol and a rx for nausea medication.

## 2021-04-04 NOTE — Progress Notes (Addendum)
ROB: Patient complains that she is still having nausea and vomiting.  Requests dissolvable Zofran instead of the tablet form.  Also requests a prescription for albuterol inhaler.  Notes a history of asthma in her teenage years, had improved however tends to worsen with pregnancy.  Prescription provided.  Discussion had regarding patient's use of methadone and her concerns with NAS.  Discussed abstinence screening at birth in detail.  Has a prior history of C-section x1, wonders if she should consider repeat or trial of labor.  Previous C-section for arrest of dilation at 5 cm after approximately 38 hours of labor with induction for postdates pregnancy.  Fetal macrosomia was noted at time of delivery (4200 g).  Discussed different factors that may have played a part in her previous delivery.  VBAC calculated score is 36.3%, so based on this a cesarean would be recommended, however advised patient that we could wait until 36 weeks to determine fetal size and make final decision if desired.  Review of chart also notes a history of gestational hypertension at term, will need baseline labs.  BMI currently 41, down from 44 at start of the pregnancy.  I discussed with patient that weight loss during early parts of pregnancy are normal.  Advised that it would be recommended for earlier delivery if her BMI remains above 40 at the end of her pregnancy.  We will also need antenatal testing and serial growth scans in third trimester.  Review of last labs notes that patient is rubella immune, discussed need for vaccination postpartum.  Also noted that hep C antibody was positive however viral load was undetectable.  Patient does report a remote history of hepatitis C, however was treated. Needs to begin aspirin. Normal MaterniT21, For AFP today.  Is scheduled for her anatomy scan.  RTC in 4 weeks.   The patient has Medicaid.  [facility] Medicaid Risk Screening Form completed today.

## 2021-04-05 ENCOUNTER — Encounter: Payer: Self-pay | Admitting: Obstetrics and Gynecology

## 2021-04-05 LAB — MICROSCOPIC EXAMINATION
Casts: NONE SEEN /lpf
Epithelial Cells (non renal): >10 /hpf — AB (ref 0–10)
RBC, Urine: NONE SEEN /hpf (ref 0–2)

## 2021-04-05 LAB — URINALYSIS, ROUTINE W REFLEX MICROSCOPIC
Bilirubin, UA: NEGATIVE
Glucose, UA: NEGATIVE
Nitrite, UA: NEGATIVE
RBC, UA: NEGATIVE
Specific Gravity, UA: 1.024 (ref 1.005–1.030)
Urobilinogen, Ur: 1 mg/dL (ref 0.2–1.0)
pH, UA: >=9 — AB (ref 5.0–7.5)

## 2021-04-06 LAB — URINE CULTURE, OB REFLEX

## 2021-04-06 LAB — GC/CHLAMYDIA PROBE AMP
Chlamydia trachomatis, NAA: NEGATIVE
Neisseria Gonorrhoeae by PCR: NEGATIVE

## 2021-04-06 LAB — CULTURE, OB URINE

## 2021-04-09 LAB — MONITOR DRUG PROFILE 14(MW)
Amphetamine Scrn, Ur: NEGATIVE ng/mL
BARBITURATE SCREEN URINE: NEGATIVE ng/mL
BENZODIAZEPINE SCREEN, URINE: NEGATIVE ng/mL
Buprenorphine, Urine: NEGATIVE ng/mL
CANNABINOIDS UR QL SCN: NEGATIVE ng/mL
Cocaine (Metab) Scrn, Ur: NEGATIVE ng/mL
Creatinine(Crt), U: 202 mg/dL (ref 20.0–300.0)
Fentanyl, Urine: NEGATIVE pg/mL
Meperidine Screen, Urine: NEGATIVE ng/mL
OXYCODONE+OXYMORPHONE UR QL SCN: NEGATIVE ng/mL
Opiate Scrn, Ur: NEGATIVE ng/mL
Ph of Urine: 8.8 (ref 4.5–8.9)
Phencyclidine Qn, Ur: NEGATIVE ng/mL
Propoxyphene Scrn, Ur: NEGATIVE ng/mL
SPECIFIC GRAVITY: 1.02
Tramadol Screen, Urine: NEGATIVE ng/mL

## 2021-04-09 LAB — METHADONE (GC/LC/MS), URINE
Methadone GC/MS Conf: 446 ng/mL
Methadone: POSITIVE — AB

## 2021-04-09 LAB — NICOTINE SCREEN, URINE: Cotinine Ql Scrn, Ur: POSITIVE ng/mL — AB

## 2021-04-12 ENCOUNTER — Other Ambulatory Visit: Payer: Medicaid Other

## 2021-04-19 ENCOUNTER — Other Ambulatory Visit: Payer: Medicaid Other

## 2021-04-24 ENCOUNTER — Ambulatory Visit: Payer: Medicaid Other

## 2021-04-26 ENCOUNTER — Encounter: Payer: Medicaid Other | Admitting: Obstetrics and Gynecology

## 2021-04-27 ENCOUNTER — Encounter: Payer: Self-pay | Admitting: Obstetrics and Gynecology

## 2021-05-02 ENCOUNTER — Encounter: Payer: Medicaid Other | Admitting: Obstetrics and Gynecology

## 2021-05-03 ENCOUNTER — Encounter: Payer: Self-pay | Admitting: Obstetrics and Gynecology

## 2021-05-03 ENCOUNTER — Other Ambulatory Visit: Payer: Self-pay

## 2021-05-03 ENCOUNTER — Ambulatory Visit (INDEPENDENT_AMBULATORY_CARE_PROVIDER_SITE_OTHER): Payer: Medicaid Other | Admitting: Obstetrics and Gynecology

## 2021-05-03 VITALS — BP 110/70 | HR 85 | Wt 247.7 lb

## 2021-05-03 DIAGNOSIS — O0992 Supervision of high risk pregnancy, unspecified, second trimester: Secondary | ICD-10-CM

## 2021-05-03 DIAGNOSIS — Z3A21 21 weeks gestation of pregnancy: Secondary | ICD-10-CM

## 2021-05-03 NOTE — Progress Notes (Signed)
ROB: Patient feeling pressure down low-unable to give a urine.  She will try again before leaving.  Also complains of some vaginal irritation like "yeast infection".  Advised use of Monistat.  If symptoms continue consider testing.  Patient taking baby aspirin as directed.  Has not increased methadone dose.  Ultrasound 2 weeks with MFM.

## 2021-05-04 ENCOUNTER — Telehealth: Payer: Self-pay | Admitting: Obstetrics and Gynecology

## 2021-05-04 MED ORDER — ONDANSETRON 4 MG PO TBDP: 4.0000 mg | ORAL_TABLET | Freq: Three times a day (TID) | ORAL | 1 refills | Status: DC | PRN

## 2021-05-04 NOTE — Telephone Encounter (Signed)
Shirley Black would like [facility] [place].  She stated they were supposed to be called in yesterday. Please advise.

## 2021-05-04 NOTE — Telephone Encounter (Signed)
LM for patient that Zofran was sent in.

## 2021-05-08 ENCOUNTER — Telehealth: Payer: Self-pay | Admitting: Obstetrics and Gynecology

## 2021-05-08 ENCOUNTER — Other Ambulatory Visit (HOSPITAL_COMMUNITY)
Admission: RE | Admit: 2021-05-08 | Discharge: 2021-05-08 | Disposition: A | Discharge status: Home / Self Care | Payer: Medicaid Other | Source: Ambulatory Visit | Attending: Certified Nurse Midwife | Admitting: Certified Nurse Midwife

## 2021-05-08 ENCOUNTER — Ambulatory Visit: Payer: Medicaid Other | Admitting: Certified Nurse Midwife

## 2021-05-08 ENCOUNTER — Other Ambulatory Visit: Payer: Self-pay

## 2021-05-08 DIAGNOSIS — B379 Candidiasis, unspecified: Secondary | ICD-10-CM

## 2021-05-08 NOTE — Telephone Encounter (Signed)
Pt called stating that she has a possible yeast infection and thrush. Pt states she discussed at her last visit. Pt having vaginal irritation, sensitivity and a white coating on her tongue. Pt is requesting a rx be called in. Pt states that monistat has not worked well for her. Please Advise.

## 2021-05-09 ENCOUNTER — Other Ambulatory Visit: Payer: Self-pay | Admitting: Certified Nurse Midwife

## 2021-05-09 LAB — CERVICOVAGINAL ANCILLARY ONLY
Bacterial Vaginitis (gardnerella): NEGATIVE
Candida Glabrata: NEGATIVE
Candida Vaginitis: POSITIVE — AB
Chlamydia: NEGATIVE
Comment: NEGATIVE
Comment: NEGATIVE
Comment: NEGATIVE
Comment: NEGATIVE
Comment: NORMAL
Neisseria Gonorrhea: NEGATIVE

## 2021-05-09 NOTE — Telephone Encounter (Signed)
Please advise 

## 2021-05-12 ENCOUNTER — Other Ambulatory Visit: Payer: Self-pay | Admitting: Maternal & Fetal Medicine

## 2021-05-12 DIAGNOSIS — F1120 Opioid dependence, uncomplicated: Secondary | ICD-10-CM

## 2021-05-12 DIAGNOSIS — Z8759 Personal history of other complications of pregnancy, childbirth and the puerperium: Secondary | ICD-10-CM

## 2021-05-12 DIAGNOSIS — O09299 Supervision of pregnancy with other poor reproductive or obstetric history, unspecified trimester: Secondary | ICD-10-CM

## 2021-05-12 DIAGNOSIS — B379 Candidiasis, unspecified: Secondary | ICD-10-CM

## 2021-05-12 DIAGNOSIS — O0992 Supervision of high risk pregnancy, unspecified, second trimester: Secondary | ICD-10-CM

## 2021-05-12 DIAGNOSIS — O99210 Obesity complicating pregnancy, unspecified trimester: Secondary | ICD-10-CM

## 2021-05-12 DIAGNOSIS — Z98891 History of uterine scar from previous surgery: Secondary | ICD-10-CM

## 2021-05-15 MED ORDER — FLUCONAZOLE 150 MG PO TABS: 150.0000 mg | ORAL_TABLET | Freq: Once | ORAL | 0 refills | Status: AC

## 2021-05-16 ENCOUNTER — Other Ambulatory Visit: Payer: Self-pay

## 2021-05-16 ENCOUNTER — Ambulatory Visit (HOSPITAL_BASED_OUTPATIENT_CLINIC_OR_DEPARTMENT_OTHER): Payer: Medicaid Other | Admitting: Obstetrics and Gynecology

## 2021-05-16 ENCOUNTER — Ambulatory Visit: Payer: Medicaid Other | Attending: Obstetrics and Gynecology

## 2021-05-16 DIAGNOSIS — O99212 Obesity complicating pregnancy, second trimester: Secondary | ICD-10-CM

## 2021-05-16 DIAGNOSIS — B182 Chronic viral hepatitis C: Secondary | ICD-10-CM | POA: Diagnosis not present

## 2021-05-16 DIAGNOSIS — Z720 Tobacco use: Secondary | ICD-10-CM

## 2021-05-16 DIAGNOSIS — O34211 Maternal care for low transverse scar from previous cesarean delivery: Secondary | ICD-10-CM | POA: Diagnosis not present

## 2021-05-16 DIAGNOSIS — Z98891 History of uterine scar from previous surgery: Secondary | ICD-10-CM

## 2021-05-16 DIAGNOSIS — F1120 Opioid dependence, uncomplicated: Secondary | ICD-10-CM

## 2021-05-16 DIAGNOSIS — O98412 Viral hepatitis complicating pregnancy, second trimester: Secondary | ICD-10-CM

## 2021-05-16 DIAGNOSIS — O99322 Drug use complicating pregnancy, second trimester: Secondary | ICD-10-CM | POA: Diagnosis not present

## 2021-05-16 DIAGNOSIS — O09292 Supervision of pregnancy with other poor reproductive or obstetric history, second trimester: Secondary | ICD-10-CM | POA: Diagnosis not present

## 2021-05-16 DIAGNOSIS — O09299 Supervision of pregnancy with other poor reproductive or obstetric history, unspecified trimester: Secondary | ICD-10-CM

## 2021-05-16 DIAGNOSIS — O0992 Supervision of high risk pregnancy, unspecified, second trimester: Secondary | ICD-10-CM | POA: Diagnosis present

## 2021-05-16 DIAGNOSIS — E669 Obesity, unspecified: Secondary | ICD-10-CM

## 2021-05-16 DIAGNOSIS — O99332 Smoking (tobacco) complicating pregnancy, second trimester: Secondary | ICD-10-CM

## 2021-05-16 DIAGNOSIS — Z3A23 23 weeks gestation of pregnancy: Secondary | ICD-10-CM

## 2021-05-16 DIAGNOSIS — N858 Other specified noninflammatory disorders of uterus: Secondary | ICD-10-CM | POA: Insufficient documentation

## 2021-05-16 DIAGNOSIS — Z8759 Personal history of other complications of pregnancy, childbirth and the puerperium: Secondary | ICD-10-CM

## 2021-05-16 DIAGNOSIS — O99210 Obesity complicating pregnancy, unspecified trimester: Secondary | ICD-10-CM

## 2021-05-16 NOTE — Progress Notes (Signed)
Maternal-Fetal Medicine   Name: Shirley Black DOB: 21-Oct-1995 MRN: [id number] Referring Provider: Hildred Laser, MD   I had the pleasure of seeing Shirley Black today at [facility], [facility].  She is G2 P1 at 23w 4d gestation and is here for fetal anatomy scan and consultation.  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  She has been on methadone since February 2020 when she became dependent on painkillers.  Currently, she does not have withdrawal symptoms.  Withdrawal symptoms consist of hot flashes and cold sweats. -Chronic hepatitis C infection was diagnosed in 2018.  And HCV quantitative RNA levels were undetectable.  She does not have HIV infection and hepatitis B screening was negative.  Patient does not use IV drugs. -History of gestational hypertension and previous pregnancy and she had mild hypertension.  Patient takes low-dose aspirin prophylaxis. -Previous cesarean delivery for failure to progress in labor.  I reviewed the op note and it was low transverse cesarean delivery. -Obesity (BMI 41.2).  History of postoperative wound infection. -History of macrosomia. -Tobacco use.  Obstetric history significant for a term cesarean delivery in 2018 of a female infant weighing 9 pounds and 4 ounces at birth.  GYN history: No history of abnormal Pap smears or cervical surgeries.  Past medical history: Chronic hepatitis C infection.  Mild intermittent asthma.  Patient takes albuterol as needed.  No history of diabetes or hypertension.  Past surgical history: Cesarean section, tonsillectomy. Medications: Methadone, aspirin, Zofran, albuterol as needed, prenatal vitamins, Tums as needed. Allergies: No known drug allergies. Social history: Patient smokes 5 cigarettes daily.  No alcohol or drug use.  Her partner is in good health.  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 Family history: No history of venous thromboembolism in the family.  Her parents are in  good health.  Blood pressure today at her office is 126/63 mmHg.  Pulse 99/minute  Ultrasound We performed a fetal anatomical survey.  Amniotic fluid is normal and good fetal activity seen.  Fetal biometry is consistent with the previously established dates.  No markers of aneuploidies or fetal structural defects are seen.  Placenta is anterior and there is no evidence of previa or placenta accreta spectrum.  Our concerns include Methadone maintenance Methadone is a synthetic opioid (full agonist). It mitigates the effects of opioid withdrawal, and it has analgesic effect as well. It is a category C drug. -Pregnancies managed with methadone have better outcomes than pregnancies with illicit drug use. -Placenta metabolizes methadone and increasing dosage may be required in the second and third trimesters. It is one of the first drugs used in treating opioid addiction in pregnancy. Methadone treatment (compared to untreated heroin use) has been shown to improve pregnancy outcomes including increased fetal growth (and birth weights), decreased fetal mortality, decreased risk of preeclampsia and HIV infections, and increased likelihood of the neonate being discharged sooner. Concomitant substance use adversely affects the pregnancy outcomes.  Methadone influences the antenatal testings. NST in mothers receiving methadone is more-likely to show loss of variability, accelerations, and dcreased heart rates. BPP can also show decreased fetal breathing movements and fetal movements.  Neonatal abstinence syndrome (NAS) is a disorder of gastrointestinal, respiratory and central nervous systems. Autonomic symptoms and signs predominate. Methadone dose during pregnancy appears to be unrelated to the severity of NAS and the need to treat infants. About 50% to 80% of infants exposed to methadone in utero have clinical features suggestive of NAS, and up to 25% may require treatment.  The onset of NAS ranges from 1  hour to 2 weeks. Symptoms include nasal stuffiness, sneezing, diarrhea, tremors, irritability, hyperactive reflexes and poor feeding.  Breastfeeding: Methadone is excreted into the breast milk in small quantities. No increased adverse effects are seen in the infants of mothers taking methadone during breastfeeding. The American Academy of Pediatrics considers methadone to be compatible with breastfeeding.  Chronic hepatitis C infection In the absence of co-infection with HIV, the perinatal transmission is low and is around 5% regardless of mode of delivery. Patient understands that this is a chronic disease. Provided the liver functions are normal, it is unlikely to adversely affect the pregnancy outcome. Presence of high viral RNA can be associated with increased perinatal transmission, and some have advocated cesarean delivery if high viral RNA is present. However, the current recommendation is that the patient should be encouraged to have vaginal delivery. I also informed the patient that breastfeeding is not contraindicated.   Direct-acting antivirals (DAAs) has been studied in pregnancy (Phase I trial). Trial using ledipasvir-sofosbuvir (LDV/SOF) in 9 pregnant women starting in the second trimester (23-24 weeks) showed all patients were cured of HCV with no perinatal transmission.  Currently, hepatitis C is not treated with antivirals in pregnancy.  Cigarette Smoking Smoking increases the risk of maternal complications that include placental abruption and placenta previa. Fetal complications include fetal growth restriction, prematurity and stillbirth. Smoking cessation at any gestational age is beneficial and should routinely be addressed in her prenatal visits. Nicotine-replacement therapy (NRT) can be considered if nonpharmacological approaches have failed. Its chief benefit is that it does not contain other toxic substances present in cigarettes. Cigarettes, in addition to nicotine and Carbon  monoxide, contain several other toxic substances.  Transdermal patch of 21 mg/day for 2 weeks followed by14 mg/day for 1 to 2 weeks and then 7 mg/day (maintenance) can be considered in her case. Overall, the benefit of NRT outweighs cigarette smoking as cigarettes contain several other compounds including carcinogens that are harmful to her and her pregnancy.  History of gestational hypertension Recurrence of gestational hypertension/preeclampsia seen and 25% to 40% of cases.  I discussed the benefit of low-dose aspirin prophylaxis that the laser prevents recurrence of preeclampsia.  Previous cesarean delivery I discussed VBAC, its benefits and risks including scar rupture (1% of cases).  Repeat cesarean deliveries increase the risk of placenta previa and/or placenta accreta spectrum.  History of macrosomia I explained the limitations of ultrasound and accurately estimating fetal weights.  We recommend fetal growth assessment around 37 to [redacted] weeks gestation if patient is planning to have VBAC.  Maternal obesity Ultrasound imposes limitations on detecting fetal anomalies that may be missed in obese pregnant women.  Patient has a history of postoperative wound infection and care should be taken in wound closure.  Recommendations -An appointment was made for her to return in 4 weeks for completion of fetal anatomy. -Fetal growth assessments every 4 weeks. -Consider nicotine replacement therapy (patches) and smoking cessation counseling.  Thank you for consultation.  If you have any questions or concerns, please contact me the [facility].  Consultation including face-to-face counseling (more than 50% of time spent) is 60 minutes.

## 2021-05-18 ENCOUNTER — Telehealth: Payer: Self-pay | Admitting: Obstetrics and Gynecology

## 2021-05-18 NOTE — Telephone Encounter (Signed)
Spoke with patient and let her know that she can treat symptoms with medication on the safe medication list. If she starts getting SOB or high fever to go to urgent care or ED.

## 2021-05-18 NOTE — Telephone Encounter (Signed)
Patient states she tested positive for COVID yesterday. She states that she is concerned because she is pregnant.  She states that she has a question concerning the colostrum

## 2021-05-29 ENCOUNTER — Telehealth: Payer: Self-pay | Admitting: Obstetrics and Gynecology

## 2021-05-29 NOTE — Telephone Encounter (Signed)
Patient stated that she is having severe nausea not throwing up but just nausea.  Patient is having a sharpe pain so bad she can feel it in her toes.  She is also having severe pressure almost unbearable

## 2021-05-29 NOTE — Telephone Encounter (Signed)
Called patient and advised her per Dr. Logan Bores to come in and leave a urine sample. She denies having any symptoms. Just complains of pressure x 1 week and pressure is gradually worsening. Also advised if pressure did not improve or worsened to go to ED. Patient said the she would wait until her appointment on Friday, if thinks got worse and she can not tolerate pain she will go to ED.

## 2021-06-02 ENCOUNTER — Encounter: Payer: Medicaid Other | Admitting: Obstetrics and Gynecology

## 2021-06-02 DIAGNOSIS — Z3402 Encounter for supervision of normal first pregnancy, second trimester: Secondary | ICD-10-CM

## 2021-06-02 DIAGNOSIS — Z3A26 26 weeks gestation of pregnancy: Secondary | ICD-10-CM

## 2021-06-05 ENCOUNTER — Other Ambulatory Visit: Payer: Self-pay | Admitting: Maternal & Fetal Medicine

## 2021-06-05 DIAGNOSIS — O99212 Obesity complicating pregnancy, second trimester: Secondary | ICD-10-CM

## 2021-06-05 DIAGNOSIS — O09292 Supervision of pregnancy with other poor reproductive or obstetric history, second trimester: Secondary | ICD-10-CM

## 2021-06-05 DIAGNOSIS — O34219 Maternal care for unspecified type scar from previous cesarean delivery: Secondary | ICD-10-CM

## 2021-06-05 DIAGNOSIS — F1120 Opioid dependence, uncomplicated: Secondary | ICD-10-CM

## 2021-06-05 DIAGNOSIS — B189 Chronic viral hepatitis, unspecified: Secondary | ICD-10-CM

## 2021-06-06 ENCOUNTER — Encounter: Payer: Medicaid Other | Admitting: Obstetrics and Gynecology

## 2021-06-13 ENCOUNTER — Ambulatory Visit (INDEPENDENT_AMBULATORY_CARE_PROVIDER_SITE_OTHER): Payer: Medicaid Other | Admitting: Obstetrics and Gynecology

## 2021-06-13 ENCOUNTER — Ambulatory Visit: Payer: Medicaid Other | Attending: Obstetrics and Gynecology

## 2021-06-13 ENCOUNTER — Other Ambulatory Visit: Payer: Self-pay

## 2021-06-13 ENCOUNTER — Encounter: Payer: Self-pay | Admitting: Obstetrics and Gynecology

## 2021-06-13 VITALS — BP 112/66 | HR 94 | Wt 244.4 lb

## 2021-06-13 DIAGNOSIS — F1120 Opioid dependence, uncomplicated: Secondary | ICD-10-CM | POA: Diagnosis not present

## 2021-06-13 DIAGNOSIS — O99212 Obesity complicating pregnancy, second trimester: Secondary | ICD-10-CM | POA: Insufficient documentation

## 2021-06-13 DIAGNOSIS — B182 Chronic viral hepatitis C: Secondary | ICD-10-CM | POA: Insufficient documentation

## 2021-06-13 DIAGNOSIS — O99322 Drug use complicating pregnancy, second trimester: Secondary | ICD-10-CM | POA: Diagnosis not present

## 2021-06-13 DIAGNOSIS — O09292 Supervision of pregnancy with other poor reproductive or obstetric history, second trimester: Secondary | ICD-10-CM

## 2021-06-13 DIAGNOSIS — Z3A27 27 weeks gestation of pregnancy: Secondary | ICD-10-CM | POA: Insufficient documentation

## 2021-06-13 DIAGNOSIS — O34219 Maternal care for unspecified type scar from previous cesarean delivery: Secondary | ICD-10-CM | POA: Diagnosis not present

## 2021-06-13 DIAGNOSIS — Z3482 Encounter for supervision of other normal pregnancy, second trimester: Secondary | ICD-10-CM

## 2021-06-13 DIAGNOSIS — O98412 Viral hepatitis complicating pregnancy, second trimester: Secondary | ICD-10-CM | POA: Diagnosis not present

## 2021-06-13 DIAGNOSIS — O09299 Supervision of pregnancy with other poor reproductive or obstetric history, unspecified trimester: Secondary | ICD-10-CM

## 2021-06-13 DIAGNOSIS — B189 Chronic viral hepatitis, unspecified: Secondary | ICD-10-CM

## 2021-06-13 DIAGNOSIS — O0992 Supervision of high risk pregnancy, unspecified, second trimester: Secondary | ICD-10-CM

## 2021-06-13 LAB — POCT URINALYSIS DIPSTICK OB
Bilirubin, UA: NEGATIVE
Blood, UA: NEGATIVE
Glucose, UA: NEGATIVE
Ketones, UA: NEGATIVE
Leukocytes, UA: NEGATIVE
Nitrite, UA: NEGATIVE
POC,PROTEIN,UA: NEGATIVE
Spec Grav, UA: >=1.03 — AB (ref 1.010–1.025)
Urobilinogen, UA: 0.2 E.U./dL
pH, UA: 6 (ref 5.0–8.0)

## 2021-06-13 MED ORDER — ONDANSETRON 4 MG PO TBDP: 4.0000 mg | ORAL_TABLET | Freq: Three times a day (TID) | ORAL | 1 refills | Status: DC | PRN

## 2021-06-13 MED ORDER — TETANUS-DIPHTH-ACELL PERTUSSIS 5-2.5-18.5 LF-MCG/0.5 IM SUSY
0.5000 mL | PREFILLED_SYRINGE | Freq: Once | INTRAMUSCULAR | Status: AC
  Administered 2021-06-13: 0.5 mL via INTRAMUSCULAR

## 2021-06-13 NOTE — Progress Notes (Signed)
OB-Pt present for routine prenatal care.  

## 2021-06-13 NOTE — Progress Notes (Signed)
ROB: Reports no problems.  Unable to do 1 hour GCT today.  Asked her to schedule it within 1 week.  Tdap given.  Patient on 39 methadone having no side effects.  Completed her anatomy ultrasound this morning.

## 2021-06-13 NOTE — Progress Notes (Signed)
ROB- tdap and btc signed. Needs to schedule 1 hr gtt.

## 2021-06-13 NOTE — Patient Instructions (Signed)
Tdap (Tetanus, Diphtheria, Pertussis) Vaccine: What You Need to Know 1. Why get vaccinated? Tdap vaccine can prevent tetanus, diphtheria, and pertussis. Diphtheria and pertussis spread from person to person. Tetanus enters the body through cuts or wounds. TETANUS (T) causes painful stiffening of the muscles. Tetanus can lead to serious health problems, including being unable to open the mouth, having trouble swallowing and breathing, or death. DIPHTHERIA (D) can lead to difficulty breathing, heart failure, paralysis, or death. PERTUSSIS (aP), also known as "whooping cough," can cause uncontrollable, violent coughing that makes it hard to breathe, eat, or drink. Pertussis can be extremely serious especially in babies and young children, causing pneumonia, convulsions, brain damage, or death. In teens and adults, it can cause weight loss, loss of bladder control, passing out, and rib fractures from severe coughing. 2. Tdap vaccine Tdap is only for children 7 years and older, adolescents, and adults.  Adolescents should receive a single dose of Tdap, preferably at age 11 or 12 years. Pregnant people should get a dose of Tdap during every pregnancy, preferably during the early part of the third trimester, to help protect the newborn from pertussis. Infants are most at risk for severe, life-threatening complications frompertussis. Adults who have never received Tdap should get a dose of Tdap. Also, adults should receive a booster dose of either Tdap or Td (a different vaccine that protects against tetanus and diphtheria but not pertussis) every 10 years, or after 5 years in the case of a severe or dirty wound or burn. Tdap may be given at the same time as other vaccines. 3. Talk with your health care provider Tell your vaccine provider if the person getting the vaccine: Has had an allergic reaction after a previous dose of any vaccine that protects against tetanus, diphtheria, or pertussis, or has any  severe, life-threatening allergies Has had a coma, decreased level of consciousness, or prolonged seizures within 7 days after a previous dose of any pertussis vaccine (DTP, DTaP, or Tdap) Has seizures or another nervous system problem Has ever had Guillain-Barr Syndrome (also called "GBS") Has had severe pain or swelling after a previous dose of any vaccine that protects against tetanus or diphtheria In some cases, your health care provider may decide to postpone Tdapvaccination until a future visit. People with minor illnesses, such as a cold, may be vaccinated. People who are moderately or severely ill should usually wait until they recover beforegetting Tdap vaccine.  Your health care provider can give you more information. 4. Risks of a vaccine reaction Pain, redness, or swelling where the shot was given, mild fever, headache, feeling tired, and nausea, vomiting, diarrhea, or stomachache sometimes happen after Tdap vaccination. People sometimes faint after medical procedures, including vaccination. Tellyour provider if you feel dizzy or have vision changes or ringing in the ears.  As with any medicine, there is a very remote chance of a vaccine causing asevere allergic reaction, other serious injury, or death. 5. What if there is a serious problem? An allergic reaction could occur after the vaccinated person leaves the clinic. If you see signs of a severe allergic reaction (hives, swelling of the face and throat, difficulty breathing, a fast heartbeat, dizziness, or weakness), call [id number] get the person to the nearest hospital. For other signs that concern you, call your health care provider.  Adverse reactions should be reported to the Vaccine Adverse Event Reporting System (VAERS). Your health care provider will usually file this report, or you can do it yourself. Visit the   VAERS website at www.vaers.hhs.gov or call 1-800-822-7967. VAERS is only for reporting reactions, and VAERS staff  members do not give medical advice. 6. The National Vaccine Injury Compensation Program The National Vaccine Injury Compensation Program (VICP) is a federal program that was created to compensate people who may have been injured by certain vaccines. Claims regarding alleged injury or death due to vaccination have a time limit for filing, which may be as short as two years. Visit the VICP website at www.hrsa.gov/vaccinecompensation or call 1-800-338-2382to learn about the program and about filing a claim. 7. How can I learn more? Ask your health care provider. Call your local or state health department. Visit the website of the Food and Drug Administration (FDA) for vaccine package inserts and additional information at www.fda.gov/vaccines-blood-biologics/vaccines. Contact the Centers for Disease Control and Prevention (CDC): Call 1-800-232-4636 (1-800-CDC-INFO) or Visit CDC's website at www.cdc.gov/vaccines. Vaccine Information Statement Tdap (Tetanus, Diphtheria, Pertussis) Vaccine(05/20/2020) This information is not intended to replace advice given to you by your health care provider. Make sure you discuss any questions you have with your healthcare provider. Document Revised: 06/15/2020 Document Reviewed: 06/15/2020 Elsevier Patient Education  2022 Elsevier Inc.  

## 2021-06-20 ENCOUNTER — Telehealth: Payer: Self-pay | Admitting: Obstetrics and Gynecology

## 2021-06-20 NOTE — Telephone Encounter (Signed)
Patient called and stated that she was prescribed Nystatin for Thrush in her mouth.  She treated for 10 days and it did not help at all. Patient is requesting prescription

## 2021-06-22 ENCOUNTER — Other Ambulatory Visit: Payer: Self-pay | Admitting: Obstetrics and Gynecology

## 2021-06-22 MED ORDER — FLUCONAZOLE 150 MG PO TABS: 150.0000 mg | ORAL_TABLET | Freq: Once | ORAL | 3 refills | Status: AC

## 2021-06-22 NOTE — Telephone Encounter (Signed)
Please advise. Thanks Dasie Chancellor 

## 2021-06-22 NOTE — Telephone Encounter (Signed)
Advise to continue using the Nystatin mouthwash, but I will also send in a tablet. She will take every 3 days until her symptoms resolve, for up to 3 doses.

## 2021-06-22 NOTE — Telephone Encounter (Signed)
Spoke to pt she is still having thrush in her mouth. Pt stated that the Nystatin mouthwash did help some but she is still having issues. Pt is requesting something else to be called in. Pt is aware that I will have to send the message to a provider. Pt voiced that she understood.

## 2021-06-23 ENCOUNTER — Other Ambulatory Visit: Payer: Medicaid Other

## 2021-06-23 ENCOUNTER — Other Ambulatory Visit: Payer: Self-pay

## 2021-06-23 DIAGNOSIS — O0992 Supervision of high risk pregnancy, unspecified, second trimester: Secondary | ICD-10-CM

## 2021-06-23 MED ORDER — NYSTATIN 100000 UNIT/ML MT SUSP: OROMUCOSAL | 0 refills | Status: DC

## 2021-06-23 NOTE — Telephone Encounter (Signed)
Spoke to pt and informed her of the information given by [facility]. Pt stated that she was having lower abd cramping and an increase in vaginal discharge. Pt was place on the schedule to see a provider next week.

## 2021-06-23 NOTE — Addendum Note (Signed)
Addended by: Silvano Bilis on: 06/23/2021 04:10 PM   Modules accepted: Orders

## 2021-06-24 LAB — CBC
Hematocrit: 33 % — ABNORMAL LOW (ref 34.0–46.6)
Hemoglobin: 11.2 g/dL (ref 11.1–15.9)
MCH: 31.9 pg (ref 26.6–33.0)
MCHC: 33.9 g/dL (ref 31.5–35.7)
MCV: 94 fL (ref 79–97)
Platelets: 204 10*3/uL (ref 150–450)
RBC: 3.51 x10E6/uL — ABNORMAL LOW (ref 3.77–5.28)
RDW: 13.3 % (ref 11.7–15.4)
WBC: 11.5 10*3/uL — ABNORMAL HIGH (ref 3.4–10.8)

## 2021-06-24 LAB — GLUCOSE, 1 HOUR GESTATIONAL: Gestational Diabetes Screen: 99 mg/dL (ref 65–139)

## 2021-06-24 LAB — RPR: RPR Ser Ql: NONREACTIVE

## 2021-06-26 ENCOUNTER — Encounter: Payer: Medicaid Other | Admitting: Certified Nurse Midwife

## 2021-06-26 ENCOUNTER — Other Ambulatory Visit: Payer: Self-pay

## 2021-06-26 ENCOUNTER — Telehealth: Payer: Self-pay | Admitting: Obstetrics and Gynecology

## 2021-06-26 MED ORDER — NYSTATIN 100000 UNIT/ML MT SUSP: OROMUCOSAL | 0 refills | Status: DC

## 2021-06-26 NOTE — Telephone Encounter (Signed)
Patient states pharmacy would not fill Nystatin due to the directions not being clear.  [facility] with clarified directions.

## 2021-06-27 NOTE — Telephone Encounter (Signed)
Spoke to pt and informed her that [facility] could [facility] [place] and [facility] up the medication.

## 2021-06-27 NOTE — Telephone Encounter (Signed)
Spoke to the pharmacy and stated that the medication was hold because they did not have it in there store it was at the [place] [facility]. Was informed to to let pt know that she can [facility] and [facility] medication.

## 2021-06-27 NOTE — Telephone Encounter (Signed)
Patient has called again about nystatin prescription.  She stated it has been 3 days since she has had anything..please advise

## 2021-06-27 NOTE — Telephone Encounter (Signed)
Spoke to pharmacy yesterday and was informed that everything was completed.

## 2021-06-29 ENCOUNTER — Telehealth: Payer: Self-pay

## 2021-06-29 ENCOUNTER — Telehealth: Payer: Self-pay | Admitting: Obstetrics and Gynecology

## 2021-06-29 NOTE — Telephone Encounter (Signed)
Called pharmacy and held the phone for over 10 minutes someone answered and then hung up. Called back again and held the phone for about 12 minutes and then was sent in vm to leave a message. All information needed was left on vm at pharmacy.

## 2021-06-29 NOTE — Telephone Encounter (Signed)
Pt called wanted to make provider aware that she believes she lost her mucus plug last night, denies any other symptoms other than the ongoing low pressure- but improving. Pt is 30 weeks and just waned tot make provider aware.

## 2021-06-29 NOTE — Telephone Encounter (Signed)
Spoke to pt and she stated that she picked up her medication about an hour ago. Pt stated that she think that she has lost her mucus plug. Informed pt that it was okay sometimes it happen and gave her signs of labor to be looking for.

## 2021-07-03 ENCOUNTER — Telehealth: Payer: Self-pay

## 2021-07-03 NOTE — Telephone Encounter (Signed)
Pt called into the office stating that her thrust has gotten worst. Pt was informed that no providers in the office had any openings at this time, but I could speak to them to see if she could be worked in. Pt stated that she was okay and could wait until her appointment on 07/11/2021. Pt was advised that if her symptoms become bothersome to please go the Urgent Care. Pt was advised that we could add her to cancellation list. Pt voiced that she understood.

## 2021-07-10 NOTE — Patient Instructions (Signed)
Breastfeeding and Breast Care It is normal to have some problems when you start to breastfeed your new baby. But there are things that you can do to take care of yourself and help prevent problems. This includes keeping your breasts healthy and making sure that your baby's mouth attaches (latches) properly to your nipple for feedings. Work with your doctor or breastfeeding specialist to find what works best for you. How does self-care benefit me? If you keep your breasts healthy and you let your baby attach to your nipples in the right way, you will avoid these problems: Cracked or sore nipples. Breasts becoming overfilled with milk. Plugged milk ducts. Low milk supply. Breast swelling or infection. How does self-care benefit my baby? By preventing problems with your breasts, you will ensure that your baby will feed well and will gain the right amount of weight. What actions can I take to care for myself during breastfeeding? Best ways to breastfeed Always make sure that your baby latches properly to breastfeed. Make sure that your baby is in a proper position. Try different breastfeeding positions to find one that works best for you and your baby. Breastfeed when you feel like you need to make your breasts less full or when your baby shows signs of hunger. This is called "breastfeeding on demand." Do not delay feedings. Try to relax when it is time to feed your baby. This helps your body release milk from your breast. To help increase milk flow, do these things before feeding: Remove a small amount of milk from your breast. Use a pump or squeeze with your hand. Apply warm, moist heat to your breast. Do this in the shower or use hand towels soaked with warm water. Massage your breasts. Do this when you are breastfeeding as well. Caring for your breasts   To help your breasts stay healthy and keep them from getting too dry: Avoid using soap on your nipples. Let your nipples air-dry for 3-4  minutes after each feeding. Do not use things like a hair dryer to dry your breasts. This can make the skin dry and will cause irritation and pain. Use only cotton bra pads to soak up breast milk that leaks. Change the pads if they become soaked with milk. If you use bra pads that can be thrown away, change them often. Put some lanolin on your nipples after breastfeeding. Pure lanolin does not need to be washed off your nipple before you feed your baby again. Pure lanolin is not harmful to your baby. Rub some breast milk into your nipples: Use your hand to squeeze out a few drops of breast milk. Gently massage the milk into your nipples. Let your nipples air-dry. Wear a supportive nursing bra. Avoid wearing: Tight clothing. Underwire bras or bras that put pressure on your breasts. Use ice to help relieve pain or swelling of your breasts: Put ice in a plastic bag. Place a towel between your skin and the bag. Leave the ice on for 20 minutes, 2-3 times a day. Follow these instructions at home: Drink enough fluid to keep your pee (urine) pale yellow. Get plenty of rest. Sleep when your baby sleeps. Talk to your doctor or breastfeeding specialist before taking any herbal supplements. Eat a balanced diet. This includes fruits, vegetables, whole grains, lean proteins, and dairy or dairy alternatives Contact a health care provider if: You have nipple pain. You have cracking or soreness in your nipples that lasts longer than 1 week. Your breasts are  overfilled with milk, and this lasts longer than 48 hours. You have a fever. You have pus-like fluid coming from your nipple. You have redness, a rash, swelling, itching, or burning on your breast. Your baby does not gain weight. Your baby loses weight. Your baby is not feeding regularly or is very sleepy and lacks energy. Summary There are things that you can do to take care of yourself and help prevent many common breastfeeding problems. Always  make sure that your baby's mouth attaches (latches) to your nipple properly to breastfeed. Keep your nipples from getting too dry, drink plenty of fluid, and get plenty of rest. Feed on demand. Do not delay feedings. This information is not intended to replace advice given to you by your health care provider. Make sure you discuss any questions you have with your health care provider. Document Revised: 03/22/2020 Document Reviewed: 03/22/2020 Elsevier Patient Education  2022 Elsevier Inc.    Common Medications Safe in Pregnancy  Acne:      Constipation:  Benzoyl Peroxide     Colace  Clindamycin      Dulcolax Suppository  Topica Erythromycin     Fibercon  Salicylic Acid      Metamucil         Miralax AVOID:        Senakot   Accutane    Cough:  Retin-A       Cough Drops  Tetracycline      Phenergan w/ Codeine if Rx  Minocycline      Robitussin (Plain & DM)  Antibiotics:     Crabs/Lice:  Ceclor       RID  Cephalosporins    AVOID:  E-Mycins      Kwell  Keflex  Macrobid/Macrodantin   Diarrhea:  Penicillin      Kao-Pectate  Zithromax      Imodium AD         PUSH FLUIDS AVOID:       Cipro     Fever:  Tetracycline      Tylenol (Regular or Extra  Minocycline       Strength)  Levaquin      Extra Strength-Do not          Exceed 8 tabs/24 hrs Caffeine:        <200mg/day (equiv. To 1 cup of coffee or  approx. 3 12 oz sodas)         Gas: Cold/Hayfever:       Gas-X  Benadryl      Mylicon  Claritin       Phazyme  **Claritin-D        Chlor-Trimeton    Headaches:  Dimetapp      ASA-Free Excedrin  Drixoral-Non-Drowsy     Cold Compress  Mucinex (Guaifenasin)     Tylenol (Regular or Extra  Sudafed/Sudafed-12 Hour     Strength)  **Sudafed PE Pseudoephedrine   Tylenol Cold & Sinus     Vicks Vapor Rub  Zyrtec  **AVOID if Problems With Blood Pressure         Heartburn: Avoid lying down for at least 1 hour after meals  Aciphex      Maalox     Rash:  Milk of  Magnesia     Benadryl    Mylanta       1% Hydrocortisone Cream  Pepcid  Pepcid Complete   Sleep Aids:  Prevacid      Ambien   Prilosec       Benadryl    Benadryl  Rolaids       Chamomile Tea  Tums (Limit 4/day)     Unisom         Tylenol PM         Warm milk-add vanilla or  Hemorrhoids:       Sugar for taste  Anusol/Anusol H.C.  (RX: Analapram 2.5%)  Sugar Substitutes:  Hydrocortisone OTC     Ok in moderation  Preparation H      Tucks        Vaseline lotion applied to tissue with wiping    Herpes:     Throat:  Acyclovir      Oragel  Famvir  Valtrex     Vaccines:         Flu Shot Leg Cramps:       *Gardasil  Benadryl      Hepatitis A         Hepatitis B Nasal Spray:       Pneumovax  Saline Nasal Spray     Polio Booster         Tetanus Nausea:       Tuberculosis test or PPD  Vitamin B6 25 mg TID   AVOID:    Dramamine      *Gardasil  Emetrol       Live Poliovirus  Ginger Root 250 mg QID    MMR (measles, mumps &  High Complex Carbs @ Bedtime    rebella)  Sea Bands-Accupressure    Varicella (Chickenpox)  Unisom 1/2 tab TID     *No known complications           If received before Pain:         Known pregnancy;   Darvocet       Resume series after  Lortab        Delivery  Percocet    Yeast:   Tramadol      Femstat  Tylenol 3      Gyne-lotrimin  Ultram       Monistat  Vicodin           MISC:         All Sunscreens           Hair Coloring/highlights          Insect Repellant's          (Including DEET)         Mystic Tans

## 2021-07-11 ENCOUNTER — Ambulatory Visit (INDEPENDENT_AMBULATORY_CARE_PROVIDER_SITE_OTHER): Payer: Medicaid Other | Admitting: Obstetrics and Gynecology

## 2021-07-11 ENCOUNTER — Encounter: Payer: Self-pay | Admitting: Obstetrics and Gynecology

## 2021-07-11 ENCOUNTER — Other Ambulatory Visit: Payer: Self-pay

## 2021-07-11 VITALS — BP 107/71 | HR 77 | Wt 240.6 lb

## 2021-07-11 DIAGNOSIS — O09299 Supervision of pregnancy with other poor reproductive or obstetric history, unspecified trimester: Secondary | ICD-10-CM

## 2021-07-11 DIAGNOSIS — O34219 Maternal care for unspecified type scar from previous cesarean delivery: Secondary | ICD-10-CM

## 2021-07-11 DIAGNOSIS — Z3A00 Weeks of gestation of pregnancy not specified: Secondary | ICD-10-CM

## 2021-07-11 DIAGNOSIS — Z8759 Personal history of other complications of pregnancy, childbirth and the puerperium: Secondary | ICD-10-CM

## 2021-07-11 DIAGNOSIS — N9489 Other specified conditions associated with female genital organs and menstrual cycle: Secondary | ICD-10-CM

## 2021-07-11 DIAGNOSIS — O0993 Supervision of high risk pregnancy, unspecified, third trimester: Secondary | ICD-10-CM

## 2021-07-11 DIAGNOSIS — Z98891 History of uterine scar from previous surgery: Secondary | ICD-10-CM

## 2021-07-11 DIAGNOSIS — Z2821 Immunization not carried out because of patient refusal: Secondary | ICD-10-CM

## 2021-07-11 DIAGNOSIS — Z3A31 31 weeks gestation of pregnancy: Secondary | ICD-10-CM

## 2021-07-11 DIAGNOSIS — F1120 Opioid dependence, uncomplicated: Secondary | ICD-10-CM

## 2021-07-11 DIAGNOSIS — O26899 Other specified pregnancy related conditions, unspecified trimester: Secondary | ICD-10-CM

## 2021-07-11 LAB — POCT URINALYSIS DIPSTICK OB
Bilirubin, UA: NEGATIVE
Blood, UA: NEGATIVE
Glucose, UA: NEGATIVE
Ketones, UA: NEGATIVE
Leukocytes, UA: NEGATIVE
Nitrite, UA: NEGATIVE
POC,PROTEIN,UA: NEGATIVE
Spec Grav, UA: 1.025 (ref 1.010–1.025)
Urobilinogen, UA: 0.2 E.U./dL
pH, UA: 6.5 (ref 5.0–8.0)

## 2021-07-11 NOTE — Progress Notes (Signed)
ROB: Patient notes that her thrush is starting to improve, did not take most recent refill of Diflucan for thrush.  Also noting that she feels like she is leaking fluid and that her baby is "about to come out". Feeling lots ofv vaginal pressure. Also has questions about when induction would be scheduled. Answered all questions, given reassurance regarding pregnancy, no PROM noted, nitrazine test neg. Discussed pain management. Still on the fence about repeat C-section vs TOLAC. But plans for epidural if she does have TOLAC. 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 Discussed available options. Has questions regarding NAS screening and length of stay for baby vs her. Discussed concerns. RTC in 2 weeks.  Has growth scan scheduled in ~ 4 weeks for BMI and h/o macrosomia. Declined flu vaccine.

## 2021-07-11 NOTE — Progress Notes (Signed)
OB-Pt present for routine prenatal care. Pt stated that she was possible leaking fluids, lower abd/vaginal pain. Pt declined flu vaccine. Pt requested to have cervical check. Pt asked about induction dates and when it would scheduled.

## 2021-07-12 ENCOUNTER — Ambulatory Visit: Payer: Medicaid Other

## 2021-07-12 NOTE — Telephone Encounter (Signed)
Pt was seen in office yesterday.

## 2021-07-14 ENCOUNTER — Other Ambulatory Visit: Payer: Self-pay | Admitting: Obstetrics and Gynecology

## 2021-07-14 DIAGNOSIS — O99213 Obesity complicating pregnancy, third trimester: Secondary | ICD-10-CM

## 2021-07-14 DIAGNOSIS — O99323 Drug use complicating pregnancy, third trimester: Secondary | ICD-10-CM

## 2021-07-14 DIAGNOSIS — O09293 Supervision of pregnancy with other poor reproductive or obstetric history, third trimester: Secondary | ICD-10-CM

## 2021-07-14 DIAGNOSIS — O34219 Maternal care for unspecified type scar from previous cesarean delivery: Secondary | ICD-10-CM

## 2021-07-14 DIAGNOSIS — F1120 Opioid dependence, uncomplicated: Secondary | ICD-10-CM

## 2021-07-14 DIAGNOSIS — B182 Chronic viral hepatitis C: Secondary | ICD-10-CM

## 2021-07-14 DIAGNOSIS — O98419 Viral hepatitis complicating pregnancy, unspecified trimester: Secondary | ICD-10-CM

## 2021-07-19 NOTE — Telephone Encounter (Signed)
As I informed her before, she may need to go to her PCP as she may have a resistant strain, and the yeast may need to be cultured.  If she is worried about possibly leaking vaginal fluid that could be amniotic fluid, then we would need to see her in the office or she could go to labor and delivery for evaluation.

## 2021-07-25 ENCOUNTER — Other Ambulatory Visit: Payer: Self-pay

## 2021-07-25 ENCOUNTER — Ambulatory Visit: Payer: Medicaid Other | Attending: Obstetrics

## 2021-07-25 ENCOUNTER — Other Ambulatory Visit: Payer: Self-pay | Admitting: Obstetrics and Gynecology

## 2021-07-25 DIAGNOSIS — O99323 Drug use complicating pregnancy, third trimester: Secondary | ICD-10-CM | POA: Insufficient documentation

## 2021-07-25 DIAGNOSIS — B182 Chronic viral hepatitis C: Secondary | ICD-10-CM

## 2021-07-25 DIAGNOSIS — Z3A33 33 weeks gestation of pregnancy: Secondary | ICD-10-CM | POA: Insufficient documentation

## 2021-07-25 DIAGNOSIS — O34219 Maternal care for unspecified type scar from previous cesarean delivery: Secondary | ICD-10-CM | POA: Diagnosis not present

## 2021-07-25 DIAGNOSIS — O99213 Obesity complicating pregnancy, third trimester: Secondary | ICD-10-CM | POA: Diagnosis not present

## 2021-07-25 DIAGNOSIS — O09293 Supervision of pregnancy with other poor reproductive or obstetric history, third trimester: Secondary | ICD-10-CM | POA: Diagnosis not present

## 2021-07-25 DIAGNOSIS — E669 Obesity, unspecified: Secondary | ICD-10-CM | POA: Insufficient documentation

## 2021-07-25 DIAGNOSIS — O98413 Viral hepatitis complicating pregnancy, third trimester: Secondary | ICD-10-CM | POA: Insufficient documentation

## 2021-07-25 DIAGNOSIS — F1120 Opioid dependence, uncomplicated: Secondary | ICD-10-CM | POA: Insufficient documentation

## 2021-07-25 DIAGNOSIS — O98419 Viral hepatitis complicating pregnancy, unspecified trimester: Secondary | ICD-10-CM

## 2021-07-26 ENCOUNTER — Ambulatory Visit (INDEPENDENT_AMBULATORY_CARE_PROVIDER_SITE_OTHER): Payer: Medicaid Other | Admitting: Obstetrics and Gynecology

## 2021-07-26 ENCOUNTER — Other Ambulatory Visit: Payer: Self-pay

## 2021-07-26 ENCOUNTER — Encounter: Payer: Self-pay | Admitting: Obstetrics and Gynecology

## 2021-07-26 VITALS — BP 119/74 | HR 98 | Wt 237.4 lb

## 2021-07-26 DIAGNOSIS — Z98891 History of uterine scar from previous surgery: Secondary | ICD-10-CM

## 2021-07-26 DIAGNOSIS — O0993 Supervision of high risk pregnancy, unspecified, third trimester: Secondary | ICD-10-CM

## 2021-07-26 DIAGNOSIS — O09299 Supervision of pregnancy with other poor reproductive or obstetric history, unspecified trimester: Secondary | ICD-10-CM

## 2021-07-26 DIAGNOSIS — F1120 Opioid dependence, uncomplicated: Secondary | ICD-10-CM

## 2021-07-26 DIAGNOSIS — Z3A33 33 weeks gestation of pregnancy: Secondary | ICD-10-CM

## 2021-07-26 DIAGNOSIS — Z3483 Encounter for supervision of other normal pregnancy, third trimester: Secondary | ICD-10-CM

## 2021-07-26 DIAGNOSIS — J4520 Mild intermittent asthma, uncomplicated: Secondary | ICD-10-CM

## 2021-07-26 LAB — POCT URINALYSIS DIPSTICK OB
Blood, UA: NEGATIVE
Glucose, UA: NEGATIVE
Nitrite, UA: NEGATIVE
Spec Grav, UA: 1.01 (ref 1.010–1.025)
Urobilinogen, UA: 0.2 E.U./dL
pH, UA: 8.5 — AB (ref 5.0–8.0)

## 2021-07-26 MED ORDER — ALBUTEROL SULFATE HFA 108 (90 BASE) MCG/ACT IN AERS: 2.0000 | INHALATION_SPRAY | Freq: Four times a day (QID) | RESPIRATORY_TRACT | 2 refills | Status: AC | PRN

## 2021-07-26 MED ORDER — ONDANSETRON 4 MG PO TBDP: 4.0000 mg | ORAL_TABLET | Freq: Three times a day (TID) | ORAL | 1 refills | Status: DC | PRN

## 2021-07-26 NOTE — Patient Instructions (Signed)
Asthma, Adult °Asthma is a long-term (chronic) condition that causes recurrent episodes in which the airways become tight and narrow. The airways are the passages that lead from the nose and mouth down into the lungs. Asthma episodes, also called asthma attacks, can cause coughing, wheezing, shortness of breath, and chest pain. The airways can also fill with mucus. During an attack, it can be difficult to breathe. Asthma attacks can range from minor to life threatening. °Asthma cannot be cured, but medicines and lifestyle changes can help control it and treat acute attacks. °What are the causes? °This condition is believed to be caused by inherited (genetic) and environmental factors, but its exact cause is not known. °There are many things that can bring on an asthma attack or make asthma symptoms worse (triggers). Asthma triggers are different for each person. Common triggers include: °Mold. °Dust. °Cigarette smoke. °Cockroaches. °Things that can cause allergy symptoms (allergens), such as animal dander or pollen from trees or grass. °Air pollutants such as household cleaners, wood smoke, smog, or chemical odors. °Cold air, weather changes, and winds (which increase molds and pollen in the air). °Strong emotional expressions such as crying or laughing hard. °Stress. °Certain medicines (such as aspirin) or types of medicines (such as beta-blockers). °Sulfites in foods and drinks. Foods and drinks that may contain sulfites include dried fruit, potato chips, and sparkling grape juice. °Infections or inflammatory conditions such as the flu, a cold, or inflammation of the nasal membranes (rhinitis). °Gastroesophageal reflux disease (GERD). °Exercise or strenuous activity. °What are the signs or symptoms? °Symptoms of this condition may occur right after asthma is triggered or many hours later. Symptoms include: °Wheezing. This can sound like whistling when you breathe. °Excessive nighttime or early morning  coughing. °Frequent or severe coughing with a common cold. °Chest tightness. °Shortness of breath. °Tiredness (fatigue) with minimal activity. °How is this diagnosed? °This condition is diagnosed based on: °Your medical history. °A physical exam. °Tests, which may include: °Lung function studies and pulmonary studies (spirometry). These tests can evaluate the flow of air in your lungs. °Allergy tests. °Imaging tests, such as X-rays. °How is this treated? °There is no cure for this condition, but treatment can help control your symptoms. Treatment for asthma usually involves: °Identifying and avoiding your asthma triggers. °Using medicines to control your symptoms. Generally, two types of medicines are used to treat asthma: °Controller medicines. These help prevent asthma symptoms from occurring. They are usually taken every day. °Fast-acting reliever or rescue medicines. These quickly relieve asthma symptoms by widening the narrow and tight airways. They are used as needed and provide short-term relief. °Using supplemental oxygen. This may be needed during a severe episode. °Using other medicines, such as: °Allergy medicines, such as antihistamines, if your asthma attacks are triggered by allergens. °Immune medicines (immunomodulators). These are medicines that help control the immune system. °Creating an asthma action plan. An asthma action plan is a written plan for managing and treating your asthma attacks. This plan includes: °A list of your asthma triggers and how to avoid them. °Information about when medicines should be taken and when their dosage should be changed. °Instructions about using a device called a peak flow meter. A peak flow meter measures how well the lungs are working and the severity of your asthma. It helps you monitor your condition. °Follow these instructions at home: °Controlling your home environment °Control your home environment in the following ways to help avoid triggers and prevent  asthma attacks: °Change your heating   and air conditioning filter regularly. °Limit your use of fireplaces and wood stoves. °Get rid of pests (such as roaches and mice) and their droppings. °Throw away plants if you see mold on them. °Clean floors and dust surfaces regularly. Use unscented cleaning products. °Try to have someone else vacuum for you regularly. Stay out of rooms while they are being vacuumed and for a short while afterward. If you vacuum, use a dust mask from a hardware store, a double-layered or microfilter vacuum cleaner bag, or a vacuum cleaner with a HEPA filter. °Replace carpet with wood, tile, or vinyl flooring. Carpet can trap dander and dust. °Use allergy-proof pillows, mattress covers, and box spring covers. °Keep your bedroom a trigger-free room. °Avoid pets and keep windows closed when allergens are in the air. °Wash beddings every week in hot water and dry them in a dryer. °Use blankets that are made of polyester or cotton. °Clean bathrooms and kitchens with bleach. If possible, have someone repaint the walls in these rooms with mold-resistant paint. Stay out of the rooms that are being cleaned and painted. °Wash your hands often with soap and water. If soap and water are not available, use hand sanitizer. °Do not allow anyone to smoke in your home. °General instructions °Take over-the-counter and prescription medicines only as told by your health care provider. °Speak with your health care provider if you have questions about how or when to take the medicines. °Make note if you are requiring more frequent dosages. °Do not use any products that contain nicotine or tobacco, such as cigarettes and e-cigarettes. If you need help quitting, ask your health care provider. Also, avoid being exposed to secondhand smoke. °Use a peak flow meter as told by your health care provider. Record and keep track of the readings. °Understand and use the asthma action plan to help minimize, or stop an asthma  attack, without needing to seek medical care. °Make sure you stay up to date on your yearly vaccinations as told by your health care provider. This may include vaccines for the flu and pneumonia. °Avoid outdoor activities when allergen counts are high and when air quality is low. °Wear a ski mask that covers your nose and mouth during outdoor winter activities. Exercise indoors on cold days if you can. °Warm up before exercising, and take time for a cool-down period after exercise. °Keep all follow-up visits as told by your health care provider. This is important. °Where to find more information °For information about asthma, turn to the Centers for Disease Control and Prevention at www.cdc.gov/asthma/faqs °For air quality information, turn to AirNow at airnow.gov °Contact a health care provider if: °You have wheezing, shortness of breath, or a cough even while you are taking medicine to prevent attacks. °The mucus you cough up (sputum) is thicker than usual. °Your sputum changes from clear or white to yellow, green, gray, or bloody. °Your medicines are causing side effects, such as a rash, itching, swelling, or trouble breathing. °You need to use a reliever medicine more than 2-3 times a week. °Your peak flow reading is still at 50-79% of your personal best after following your action plan for 1 hour. °You have a fever. °Get help right away if: °You are getting worse and do not respond to treatment during an asthma attack. °You are short of breath when at rest or when doing very little physical activity. °You have difficulty eating, drinking, or talking. °You have chest pain or tightness. °You develop a fast heartbeat or   palpitations. °You have a bluish color to your lips or fingernails. °You are light-headed or dizzy, or you faint. °Your peak flow reading is less than 50% of your personal best. °You feel too tired to breathe normally. °Summary °Asthma is a long-term (chronic) condition that causes recurrent  episodes in which the airways become tight and narrow. These episodes can cause coughing, wheezing, shortness of breath, and chest pain. °Asthma cannot be cured, but medicines and lifestyle changes can help control it and treat acute attacks. °Make sure you understand how to avoid triggers and how and when to use your medicines. °Asthma attacks can range from minor to life threatening. Get help right away if you have an asthma attack and do not respond to treatment with your usual rescue medicines. °This information is not intended to replace advice given to you by your health care provider. Make sure you discuss any questions you have with your health care provider. °Document Revised: 07/01/2020 Document Reviewed: 02/03/2020 °Elsevier Patient Education © 2022 Else[employer] Inc. ° °

## 2021-07-26 NOTE — Progress Notes (Signed)
ROB: She continues to have the oral thrush and thick white vaginal discharge. She has been treating thrush with the nystatin, but no relief. She has been taking Diflucan for the vaginal discharge. Denies itching, but has some burning with intercourse. She said she needs a refill on her Albuterol inhaler and Zofran.

## 2021-07-26 NOTE — Progress Notes (Signed)
ROB: Reports that needs refill on her Albuterol inhaler. States that with weather changes have made her breathing worse. Her previous inhaler brand was discontinued. Needs refill of Zofran, still having bad nausea but no vomiting.  Desires Mirena IUD for contraception. Still noting "thrush" on her tongue despite multiple treatments. Had not contacted PCP for further evaluation. Offered to perform tongue scraping to examine particles but notes she is in a hurry today as she just bought a house in [facility] and is due to sign papers today.  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 Will need to begin antenatal testing at 36 weeks.

## 2021-08-01 ENCOUNTER — Other Ambulatory Visit: Payer: Self-pay

## 2021-08-01 MED ORDER — FLUCONAZOLE 150 MG PO TABS: 150.0000 mg | ORAL_TABLET | Freq: Once | ORAL | 0 refills | Status: AC

## 2021-08-02 ENCOUNTER — Other Ambulatory Visit: Payer: Self-pay

## 2021-08-02 MED ORDER — FLUCONAZOLE 150 MG PO TABS: 150.0000 mg | ORAL_TABLET | Freq: Once | ORAL | 0 refills | Status: AC

## 2021-08-08 ENCOUNTER — Ambulatory Visit (INDEPENDENT_AMBULATORY_CARE_PROVIDER_SITE_OTHER): Payer: Medicaid Other

## 2021-08-08 ENCOUNTER — Other Ambulatory Visit: Payer: Self-pay | Admitting: Obstetrics and Gynecology

## 2021-08-08 ENCOUNTER — Other Ambulatory Visit: Payer: Self-pay

## 2021-08-08 DIAGNOSIS — O0993 Supervision of high risk pregnancy, unspecified, third trimester: Secondary | ICD-10-CM | POA: Diagnosis not present

## 2021-08-08 DIAGNOSIS — Z98891 History of uterine scar from previous surgery: Secondary | ICD-10-CM | POA: Diagnosis not present

## 2021-08-08 DIAGNOSIS — F1120 Opioid dependence, uncomplicated: Secondary | ICD-10-CM

## 2021-08-09 ENCOUNTER — Encounter: Payer: Self-pay | Admitting: Obstetrics and Gynecology

## 2021-08-09 ENCOUNTER — Telehealth: Payer: Self-pay | Admitting: Obstetrics and Gynecology

## 2021-08-09 ENCOUNTER — Ambulatory Visit (INDEPENDENT_AMBULATORY_CARE_PROVIDER_SITE_OTHER): Payer: Medicaid Other | Admitting: Obstetrics and Gynecology

## 2021-08-09 VITALS — BP 125/69 | HR 106 | Wt 237.6 lb

## 2021-08-09 DIAGNOSIS — Z3A35 35 weeks gestation of pregnancy: Secondary | ICD-10-CM

## 2021-08-09 DIAGNOSIS — O0993 Supervision of high risk pregnancy, unspecified, third trimester: Secondary | ICD-10-CM

## 2021-08-09 LAB — POCT URINALYSIS DIPSTICK OB
Bilirubin, UA: NEGATIVE
Blood, UA: NEGATIVE
Glucose, UA: NEGATIVE
Ketones, UA: NEGATIVE
Leukocytes, UA: NEGATIVE
Nitrite, UA: NEGATIVE
POC,PROTEIN,UA: NEGATIVE
Spec Grav, UA: 1.01 (ref 1.010–1.025)
Urobilinogen, UA: 0.2 E.U./dL
pH, UA: 8 (ref 5.0–8.0)

## 2021-08-09 NOTE — Addendum Note (Signed)
Addended by: Silvano Bilis on: 08/09/2021 10:18 AM   Modules accepted: Orders

## 2021-08-09 NOTE — Progress Notes (Signed)
   OB-Pt present for routine prenatal care. Pt stated fetal movement present; no contractions present; no vaginal bleeding and changes in vaginal discharge pt currently being treated for yeast infection.     Pt c/o vaginal pain/ pressure.

## 2021-08-09 NOTE — Progress Notes (Signed)
ROB: Recent ultrasound reviewed.  Patient has concerns regarding the fact that she "did not dilate with her last pregnancy".  Because of this and her concerns regarding labor she has elected to have a repeat cesarean delivery.  Risk benefits of repeat versus risk benefits of TOLAC discussed.  All questions answered.  Cesarean scheduled for 11/21.

## 2021-08-09 NOTE — Telephone Encounter (Signed)
Pt s/w insurance they do not cover weight loss medication, they will cover injections with dx of diabetes - type 2. Please Advise.

## 2021-08-10 NOTE — Telephone Encounter (Signed)
This message is for [employer]". I got the same message for her. I spoke with Sam and she clarified it.

## 2021-08-11 LAB — GC/CHLAMYDIA PROBE AMP
Chlamydia trachomatis, NAA: NEGATIVE
Neisseria Gonorrhoeae by PCR: NEGATIVE

## 2021-08-11 LAB — STREP GP B NAA: Strep Gp B NAA: NEGATIVE

## 2021-08-14 ENCOUNTER — Telehealth: Payer: Self-pay | Admitting: Obstetrics and Gynecology

## 2021-08-14 NOTE — Telephone Encounter (Signed)
LMTRC

## 2021-08-14 NOTE — Telephone Encounter (Signed)
Pt called stating that she has been sick for 2 weeks now with no improvements. Pt has cough and congestion. Pt has had a negative covid tests, and no fever. Pt has attempted to treat with Tylenol Sinus, Delsym, Mucinex and Sudafed. Pt states that she is scheduled for a c-section on the 21st. Please advise.

## 2021-08-15 ENCOUNTER — Other Ambulatory Visit: Payer: Self-pay

## 2021-08-15 MED ORDER — ONDANSETRON 4 MG PO TBDP: 4.0000 mg | ORAL_TABLET | Freq: Three times a day (TID) | ORAL | 1 refills | Status: DC | PRN

## 2021-08-17 ENCOUNTER — Other Ambulatory Visit: Payer: Medicaid Other

## 2021-08-17 ENCOUNTER — Other Ambulatory Visit: Payer: Self-pay

## 2021-08-17 ENCOUNTER — Encounter: Payer: Medicaid Other | Admitting: Obstetrics and Gynecology

## 2021-08-17 DIAGNOSIS — O0993 Supervision of high risk pregnancy, unspecified, third trimester: Secondary | ICD-10-CM

## 2021-08-17 DIAGNOSIS — Z3A36 36 weeks gestation of pregnancy: Secondary | ICD-10-CM

## 2021-08-22 ENCOUNTER — Other Ambulatory Visit: Payer: Self-pay | Admitting: Obstetrics and Gynecology

## 2021-08-22 DIAGNOSIS — Z01818 Encounter for other preprocedural examination: Secondary | ICD-10-CM

## 2021-08-22 NOTE — Patient Instructions (Signed)

## 2021-08-23 ENCOUNTER — Encounter: Payer: Self-pay | Admitting: Obstetrics and Gynecology

## 2021-08-23 ENCOUNTER — Other Ambulatory Visit: Payer: Medicaid Other

## 2021-08-23 ENCOUNTER — Ambulatory Visit (INDEPENDENT_AMBULATORY_CARE_PROVIDER_SITE_OTHER): Payer: Medicaid Other | Admitting: Obstetrics and Gynecology

## 2021-08-23 ENCOUNTER — Other Ambulatory Visit: Payer: Self-pay

## 2021-08-23 VITALS — BP 122/65 | HR 96 | Wt 236.7 lb

## 2021-08-23 DIAGNOSIS — F1120 Opioid dependence, uncomplicated: Secondary | ICD-10-CM

## 2021-08-23 DIAGNOSIS — B370 Candidal stomatitis: Secondary | ICD-10-CM

## 2021-08-23 DIAGNOSIS — O99323 Drug use complicating pregnancy, third trimester: Secondary | ICD-10-CM | POA: Diagnosis not present

## 2021-08-23 DIAGNOSIS — O0993 Supervision of high risk pregnancy, unspecified, third trimester: Secondary | ICD-10-CM

## 2021-08-23 DIAGNOSIS — Z98891 History of uterine scar from previous surgery: Secondary | ICD-10-CM

## 2021-08-23 DIAGNOSIS — B3731 Acute candidiasis of vulva and vagina: Secondary | ICD-10-CM

## 2021-08-23 DIAGNOSIS — O99210 Obesity complicating pregnancy, unspecified trimester: Secondary | ICD-10-CM | POA: Diagnosis not present

## 2021-08-23 DIAGNOSIS — Z3A37 37 weeks gestation of pregnancy: Secondary | ICD-10-CM

## 2021-08-23 MED ORDER — TERCONAZOLE 0.4 % VA CREA: 1.0000 | TOPICAL_CREAM | Freq: Every day | VAGINAL | 0 refills | Status: AC

## 2021-08-23 NOTE — Progress Notes (Signed)
ROB: Patient notes intermittent leaking of fluid over the past few days with coughing.  Has had a bad cold for the past 2 weeks.  Does know the times that it has been urine, but this time if was different (had no color or odor). Nitrazine test negative, no fluid pooling on speculum exam. Still noting thrush of her tongue, despite multiple treatments. Also still noting some irritation and thinks she has another yeast infection.  WIll prescribe Terazole cream vaginally. Can attempt to address resistant thrush while inpatient after C-section. NST performed today was reviewed and was found to be reactive.  Continue recommended antenatal testing and prenatal care.    NONSTRESS TEST INTERPRETATION  INDICATIONS:  History of Methadone use and Obesity  FHR baseline: 120 bpm RESULTS:Reactive and Inconclusive COMMENTS: No contractions   PLAN: 1. Continue fetal kick counts. 2. Continue antepartum testing as scheduled weekly

## 2021-08-23 NOTE — Progress Notes (Signed)
OB-Pt present for routine prenatal care and NST. Pt c/o a lot of vaginal discharge.

## 2021-08-24 ENCOUNTER — Encounter: Payer: Self-pay | Admitting: Obstetrics and Gynecology

## 2021-08-24 ENCOUNTER — Encounter: Payer: Medicaid Other | Admitting: Obstetrics and Gynecology

## 2021-08-24 ENCOUNTER — Other Ambulatory Visit: Payer: Medicaid Other

## 2021-08-24 DIAGNOSIS — O0993 Supervision of high risk pregnancy, unspecified, third trimester: Secondary | ICD-10-CM

## 2021-08-24 DIAGNOSIS — Z3A37 37 weeks gestation of pregnancy: Secondary | ICD-10-CM

## 2021-08-24 NOTE — Telephone Encounter (Signed)
Pt seen in clinic on 11/8.

## 2021-08-25 ENCOUNTER — Encounter
Admission: RE | Admit: 2021-08-25 | Discharge: 2021-08-25 | Disposition: A | Discharge status: Home / Self Care | Payer: Medicaid Other | Source: Ambulatory Visit | Attending: Obstetrics and Gynecology | Admitting: Obstetrics and Gynecology

## 2021-08-25 ENCOUNTER — Other Ambulatory Visit: Payer: Self-pay

## 2021-08-25 HISTORY — DX: Gastro-esophageal reflux disease without esophagitis: K21.9

## 2021-08-25 NOTE — Patient Instructions (Signed)
Your procedure is scheduled on: Monday 09/04/21 Report to [facility], [facility] THE EMERGENCY DEPARTMENT ARRIVE AT 5:30 AM [phone]  Remember: Instructions that are not followed completely may result in serious medical risk, up to and including death, or upon the discretion of your surgeon and anesthesiologist your surgery may need to be rescheduled.     _X__ 1. Do not eat food or drink liquids after midnight the night before your procedure.                 No gum chewing or hard candies.   __X__2.  On the morning of surgery brush your teeth with toothpaste and water, you                 may rinse your mouth with mouthwash if you wish.  Do not swallow any              toothpaste of mouthwash.     _X__ 3.  No Alcohol for 24 hours before or after surgery.   _X__ 4.  Do Not Smoke or use e-cigarettes For 24 Hours Prior to Your Surgery.                 Do not use any chewable tobacco products for at least 6 hours prior to                 surgery.  ____  5.  Bring all medications with you on the day of surgery if instructed.   __X__  6.  Notify your doctor if there is any change in your medical condition      (cold, fever, infections).     Do not wear jewelry, make-up, hairpins, clips or nail polish. Do not wear lotions, powders, or perfumes.  Do not shave body hair 48 hours prior to surgery. Men may shave face and neck. Do not bring valuables to the hospital.    [facility] is not responsible for any belongings or valuables.  Contacts, dentures/partials or body piercings may not be worn into surgery. Bring a case for your contacts, glasses or hearing aids, a denture cup will be supplied. Leave your suitcase in the car. After surgery it may be brought to your room. For patients admitted to the hospital, discharge time is determined by your treatment team.   Patients discharged the day of surgery will not be allowed to drive home.   Please read over the following fact sheets  that you were given:   CHG soap  __X__ Take these medicines the morning of surgery with A SIP OF WATER:    1. methadone (DOLOPHINE) 10 MG/ML solution  Take 95 mg by mouth daily  2.   3.   4.  5.  6.  ____ Fleet Enema (as directed)   __X__ Use CHG Soap/SAGE wipes as directed  ____ Use inhalers on the day of surgery  ____ Stop metformin/Janumet/Farxiga 2 days prior to surgery    ____ Take 1/2 of usual insulin dose the night before surgery. No insulin the morning          of surgery.   ____ Stop Blood Thinners Coumadin/Plavix/Xarelto/Pleta/Pradaxa/Eliquis/Effient/Aspirin  on   Or contact your Surgeon, Cardiologist or Medical Doctor regarding  ability to stop your blood thinners  __X__ Stop Anti-inflammatories 7 days before surgery such as Advil, Ibuprofen, Motrin,  BC or Goodies Powder, Naprosyn, Naproxen, Aleve, Aspirin    __X__ Stop all herbal supplements, fish oil or vitamins until after  surgery.    ____ Bring C-Pap to the hospital.

## 2021-08-29 NOTE — Patient Instructions (Signed)

## 2021-08-30 ENCOUNTER — Encounter: Payer: Self-pay | Admitting: Obstetrics and Gynecology

## 2021-08-30 ENCOUNTER — Ambulatory Visit (INDEPENDENT_AMBULATORY_CARE_PROVIDER_SITE_OTHER): Payer: Medicaid Other | Admitting: Obstetrics and Gynecology

## 2021-08-30 ENCOUNTER — Other Ambulatory Visit: Payer: Medicaid Other

## 2021-08-30 ENCOUNTER — Other Ambulatory Visit: Payer: Self-pay

## 2021-08-30 VITALS — Wt 235.0 lb

## 2021-08-30 DIAGNOSIS — Z3A38 38 weeks gestation of pregnancy: Secondary | ICD-10-CM

## 2021-08-30 DIAGNOSIS — O0993 Supervision of high risk pregnancy, unspecified, third trimester: Secondary | ICD-10-CM | POA: Diagnosis not present

## 2021-08-30 LAB — POCT URINALYSIS DIPSTICK OB
Bilirubin, UA: NEGATIVE
Blood, UA: NEGATIVE
Glucose, UA: NEGATIVE
Ketones, UA: NEGATIVE
Leukocytes, UA: NEGATIVE
Nitrite, UA: NEGATIVE
POC,PROTEIN,UA: NEGATIVE
Spec Grav, UA: 1.015 (ref 1.010–1.025)
Urobilinogen, UA: 0.2 E.U./dL
pH, UA: 8 (ref 5.0–8.0)

## 2021-08-30 NOTE — Progress Notes (Signed)
OB-pt present for routine prenatal care and NST.  

## 2021-08-30 NOTE — Progress Notes (Signed)
ROB:  CD Monday

## 2021-08-31 ENCOUNTER — Inpatient Hospital Stay: Admission: RE | Admit: 2021-08-31 | Payer: Medicaid Other | Source: Ambulatory Visit

## 2021-08-31 ENCOUNTER — Other Ambulatory Visit: Payer: Medicaid Other

## 2021-09-01 ENCOUNTER — Other Ambulatory Visit
Admission: RE | Admit: 2021-09-01 | Discharge: 2021-09-01 | Disposition: A | Discharge status: Home / Self Care | Payer: Medicaid Other | Source: Ambulatory Visit | Attending: Obstetrics and Gynecology | Admitting: Obstetrics and Gynecology

## 2021-09-01 ENCOUNTER — Other Ambulatory Visit: Payer: Self-pay

## 2021-09-01 DIAGNOSIS — Z20822 Contact with and (suspected) exposure to covid-19: Secondary | ICD-10-CM | POA: Diagnosis not present

## 2021-09-01 DIAGNOSIS — Z01812 Encounter for preprocedural laboratory examination: Secondary | ICD-10-CM | POA: Diagnosis not present

## 2021-09-01 DIAGNOSIS — Z01818 Encounter for other preprocedural examination: Secondary | ICD-10-CM

## 2021-09-01 LAB — CBC
HCT: 34.1 % — ABNORMAL LOW (ref 36.0–46.0)
Hemoglobin: 11.2 g/dL — ABNORMAL LOW (ref 12.0–15.0)
MCH: 30.1 pg (ref 26.0–34.0)
MCHC: 32.8 g/dL (ref 30.0–36.0)
MCV: 91.7 fL (ref 80.0–100.0)
Platelets: 226 10*3/uL (ref 150–400)
RBC: 3.72 MIL/uL — ABNORMAL LOW (ref 3.87–5.11)
RDW: 13.5 % (ref 11.5–15.5)
WBC: 9.5 10*3/uL (ref 4.0–10.5)
nRBC: 0 % (ref 0.0–0.2)

## 2021-09-02 LAB — SARS CORONAVIRUS 2 (TAT 6-24 HRS): SARS Coronavirus 2: NEGATIVE

## 2021-09-04 ENCOUNTER — Inpatient Hospital Stay
Admission: RE | Admit: 2021-09-04 | Discharge: 2021-09-07 | DRG: 787 | Disposition: A | Discharge status: Home / Self Care | Payer: Medicaid Other | Attending: Obstetrics and Gynecology | Admitting: Obstetrics and Gynecology

## 2021-09-04 ENCOUNTER — Emergency Department: Admission: EM | Admit: 2021-09-04 | Payer: Medicaid Other | Source: Home / Self Care

## 2021-09-04 ENCOUNTER — Inpatient Hospital Stay: Payer: Medicaid Other | Admitting: Anesthesiology

## 2021-09-04 ENCOUNTER — Encounter
Admission: RE | Disposition: A | Discharge status: Home / Self Care | Payer: Self-pay | Source: Home / Self Care | Attending: Obstetrics and Gynecology

## 2021-09-04 ENCOUNTER — Encounter: Payer: Self-pay | Admitting: Obstetrics and Gynecology

## 2021-09-04 ENCOUNTER — Other Ambulatory Visit: Payer: Self-pay

## 2021-09-04 DIAGNOSIS — O99210 Obesity complicating pregnancy, unspecified trimester: Secondary | ICD-10-CM

## 2021-09-04 DIAGNOSIS — O99324 Drug use complicating childbirth: Secondary | ICD-10-CM | POA: Diagnosis present

## 2021-09-04 DIAGNOSIS — B370 Candidal stomatitis: Secondary | ICD-10-CM | POA: Diagnosis present

## 2021-09-04 DIAGNOSIS — O99314 Alcohol use complicating childbirth: Secondary | ICD-10-CM | POA: Diagnosis not present

## 2021-09-04 DIAGNOSIS — F32A Depression, unspecified: Secondary | ICD-10-CM | POA: Diagnosis not present

## 2021-09-04 DIAGNOSIS — Z3490 Encounter for supervision of normal pregnancy, unspecified, unspecified trimester: Secondary | ICD-10-CM

## 2021-09-04 DIAGNOSIS — F1190 Opioid use, unspecified, uncomplicated: Secondary | ICD-10-CM | POA: Diagnosis present

## 2021-09-04 DIAGNOSIS — J45909 Unspecified asthma, uncomplicated: Secondary | ICD-10-CM | POA: Diagnosis present

## 2021-09-04 DIAGNOSIS — O34211 Maternal care for low transverse scar from previous cesarean delivery: Principal | ICD-10-CM

## 2021-09-04 DIAGNOSIS — O9952 Diseases of the respiratory system complicating childbirth: Secondary | ICD-10-CM | POA: Diagnosis present

## 2021-09-04 DIAGNOSIS — O9882 Other maternal infectious and parasitic diseases complicating childbirth: Secondary | ICD-10-CM | POA: Diagnosis present

## 2021-09-04 DIAGNOSIS — O99344 Other mental disorders complicating childbirth: Secondary | ICD-10-CM

## 2021-09-04 DIAGNOSIS — F419 Anxiety disorder, unspecified: Secondary | ICD-10-CM

## 2021-09-04 DIAGNOSIS — O09293 Supervision of pregnancy with other poor reproductive or obstetric history, third trimester: Secondary | ICD-10-CM | POA: Diagnosis not present

## 2021-09-04 DIAGNOSIS — F1120 Opioid dependence, uncomplicated: Secondary | ICD-10-CM

## 2021-09-04 DIAGNOSIS — O99214 Obesity complicating childbirth: Secondary | ICD-10-CM

## 2021-09-04 DIAGNOSIS — E669 Obesity, unspecified: Secondary | ICD-10-CM | POA: Diagnosis not present

## 2021-09-04 DIAGNOSIS — Z3A39 39 weeks gestation of pregnancy: Secondary | ICD-10-CM | POA: Diagnosis not present

## 2021-09-04 SURGERY — Surgical Case
Anesthesia: Spinal

## 2021-09-04 MED ORDER — SIMETHICONE 80 MG PO CHEW
80.0000 mg | CHEWABLE_TABLET | Freq: Four times a day (QID) | ORAL | Status: DC
  Administered 2021-09-04 – 2021-09-07 (×13): 80 mg via ORAL
  Filled 2021-09-04 (×13): qty 1

## 2021-09-04 MED ORDER — NALBUPHINE HCL 10 MG/ML IJ SOLN: 5.0000 mg | INTRAMUSCULAR | Status: DC | PRN

## 2021-09-04 MED ORDER — CEFAZOLIN SODIUM-DEXTROSE 2-4 GM/100ML-% IV SOLN
INTRAVENOUS | Status: AC
  Administered 2021-09-04: 2 g via INTRAVENOUS
  Filled 2021-09-04: qty 100

## 2021-09-04 MED ORDER — KETOROLAC TROMETHAMINE 30 MG/ML IJ SOLN
INTRAMUSCULAR | Status: AC
  Filled 2021-09-04: qty 1

## 2021-09-04 MED ORDER — OXYTOCIN-SODIUM CHLORIDE 30-0.9 UT/500ML-% IV SOLN
INTRAVENOUS | Status: DC | PRN
  Administered 2021-09-04: 500 mL via INTRAVENOUS

## 2021-09-04 MED ORDER — OXYTOCIN-SODIUM CHLORIDE 30-0.9 UT/500ML-% IV SOLN
INTRAVENOUS | Status: AC
  Filled 2021-09-04: qty 1000

## 2021-09-04 MED ORDER — 0.9 % SODIUM CHLORIDE (POUR BTL) OPTIME
TOPICAL | Status: DC | PRN
  Administered 2021-09-04: 250 mL

## 2021-09-04 MED ORDER — CEFAZOLIN SODIUM-DEXTROSE 2-4 GM/100ML-% IV SOLN: 2.0000 g | INTRAVENOUS | Status: DC

## 2021-09-04 MED ORDER — MORPHINE SULFATE (PF) 0.5 MG/ML IJ SOLN
INTRAMUSCULAR | Status: DC | PRN
  Administered 2021-09-04: .1 mg via INTRATHECAL

## 2021-09-04 MED ORDER — ZOLPIDEM TARTRATE 5 MG PO TABS: 5.0000 mg | ORAL_TABLET | Freq: Every evening | ORAL | Status: DC | PRN

## 2021-09-04 MED ORDER — MIDAZOLAM HCL 2 MG/2ML IJ SOLN
INTRAMUSCULAR | Status: AC
  Filled 2021-09-04: qty 2

## 2021-09-04 MED ORDER — LACTATED RINGERS IV BOLUS
500.0000 mL | Freq: Once | INTRAVENOUS | Status: AC
  Administered 2021-09-04: 500 mL via INTRAVENOUS

## 2021-09-04 MED ORDER — ACETAMINOPHEN 10 MG/ML IV SOLN
INTRAVENOUS | Status: AC
  Filled 2021-09-04: qty 100

## 2021-09-04 MED ORDER — BUPIVACAINE IN DEXTROSE 0.75-8.25 % IT SOLN
INTRATHECAL | Status: DC | PRN
  Administered 2021-09-04: 1.5 mL via INTRATHECAL

## 2021-09-04 MED ORDER — ONDANSETRON HCL 4 MG/2ML IJ SOLN
INTRAMUSCULAR | Status: AC
  Filled 2021-09-04: qty 2

## 2021-09-04 MED ORDER — MORPHINE SULFATE (PF) 0.5 MG/ML IJ SOLN
INTRAMUSCULAR | Status: DC | PRN
  Administered 2021-09-04: 2.5 mg via INTRAVENOUS
  Administered 2021-09-04: 2.4 mg via INTRAVENOUS

## 2021-09-04 MED ORDER — ONDANSETRON HCL 4 MG/2ML IJ SOLN
INTRAMUSCULAR | Status: DC | PRN
  Administered 2021-09-04: 4 mg via INTRAVENOUS

## 2021-09-04 MED ORDER — SOD CITRATE-CITRIC ACID 500-334 MG/5ML PO SOLN
ORAL | Status: AC
  Administered 2021-09-04: 30 mL
  Filled 2021-09-04: qty 15

## 2021-09-04 MED ORDER — FENTANYL CITRATE (PF) 100 MCG/2ML IJ SOLN
INTRAMUSCULAR | Status: AC
  Filled 2021-09-04: qty 2

## 2021-09-04 MED ORDER — DIPHENHYDRAMINE HCL 50 MG/ML IJ SOLN: 12.5000 mg | INTRAMUSCULAR | Status: DC | PRN

## 2021-09-04 MED ORDER — NALOXONE HCL 0.4 MG/ML IJ SOLN: 0.4000 mg | INTRAMUSCULAR | Status: DC | PRN

## 2021-09-04 MED ORDER — SODIUM CHLORIDE 0.9% FLUSH: 3.0000 mL | INTRAVENOUS | Status: DC | PRN

## 2021-09-04 MED ORDER — LACTATED RINGERS IV SOLN: INTRAVENOUS | Status: DC

## 2021-09-04 MED ORDER — KETAMINE HCL 50 MG/ML IJ SOLN
INTRAMUSCULAR | Status: AC
  Filled 2021-09-04: qty 10

## 2021-09-04 MED ORDER — CEFAZOLIN IN SODIUM CHLORIDE 3-0.9 GM/100ML-% IV SOLN: 3.0000 g | INTRAVENOUS | Status: DC

## 2021-09-04 MED ORDER — FENTANYL CITRATE (PF) 100 MCG/2ML IJ SOLN
INTRAMUSCULAR | Status: DC | PRN
  Administered 2021-09-04: 35 ug via INTRAVENOUS
  Administered 2021-09-04: 50 ug via INTRAVENOUS

## 2021-09-04 MED ORDER — ACETAMINOPHEN 10 MG/ML IV SOLN
INTRAVENOUS | Status: DC | PRN
  Administered 2021-09-04: 1000 mg via INTRAVENOUS

## 2021-09-04 MED ORDER — FENTANYL CITRATE (PF) 100 MCG/2ML IJ SOLN
INTRAMUSCULAR | Status: DC | PRN
  Administered 2021-09-04: 15 ug via INTRATHECAL

## 2021-09-04 MED ORDER — OXYCODONE HCL 5 MG PO TABS
5.0000 mg | ORAL_TABLET | ORAL | Status: DC | PRN
  Administered 2021-09-04: 5 mg via ORAL
  Filled 2021-09-04: qty 1

## 2021-09-04 MED ORDER — HYDROXYZINE HCL 25 MG PO TABS
25.0000 mg | ORAL_TABLET | Freq: Three times a day (TID) | ORAL | Status: DC
  Administered 2021-09-04 – 2021-09-07 (×9): 25 mg via ORAL
  Filled 2021-09-04 (×9): qty 1

## 2021-09-04 MED ORDER — IBUPROFEN 600 MG PO TABS
600.0000 mg | ORAL_TABLET | Freq: Four times a day (QID) | ORAL | Status: DC
  Administered 2021-09-04 – 2021-09-05 (×4): 600 mg via ORAL
  Filled 2021-09-04 (×5): qty 1

## 2021-09-04 MED ORDER — PRENATAL MULTIVITAMIN CH
1.0000 | ORAL_TABLET | Freq: Every day | ORAL | Status: DC
  Administered 2021-09-05 – 2021-09-07 (×3): 1 via ORAL
  Filled 2021-09-04 (×3): qty 1

## 2021-09-04 MED ORDER — POVIDONE-IODINE 10 % EX SWAB: 2.0000 "application " | Freq: Once | CUTANEOUS | Status: DC

## 2021-09-04 MED ORDER — KETAMINE HCL 50 MG/ML IJ SOLN
INTRAMUSCULAR | Status: DC | PRN
  Administered 2021-09-04: 25 mg via INTRAVENOUS

## 2021-09-04 MED ORDER — ACETAMINOPHEN 500 MG PO TABS: 1000.0000 mg | ORAL_TABLET | Freq: Four times a day (QID) | ORAL | Status: AC

## 2021-09-04 MED ORDER — ONDANSETRON HCL 4 MG/2ML IJ SOLN: 4.0000 mg | Freq: Three times a day (TID) | INTRAMUSCULAR | Status: DC | PRN

## 2021-09-04 MED ORDER — KETOROLAC TROMETHAMINE 30 MG/ML IJ SOLN: 30.0000 mg | Freq: Four times a day (QID) | INTRAMUSCULAR | Status: AC | PRN

## 2021-09-04 MED ORDER — DIPHENHYDRAMINE HCL 25 MG PO CAPS: 25.0000 mg | ORAL_CAPSULE | Freq: Four times a day (QID) | ORAL | Status: DC | PRN

## 2021-09-04 MED ORDER — OXYCODONE-ACETAMINOPHEN 5-325 MG PO TABS
1.0000 | ORAL_TABLET | ORAL | Status: DC | PRN
  Administered 2021-09-04 (×3): 2 via ORAL
  Administered 2021-09-05: 1 via ORAL
  Administered 2021-09-05 – 2021-09-07 (×11): 2 via ORAL
  Filled 2021-09-04: qty 1
  Filled 2021-09-04 (×14): qty 2

## 2021-09-04 MED ORDER — OXYTOCIN-SODIUM CHLORIDE 30-0.9 UT/500ML-% IV SOLN
2.5000 [IU]/h | INTRAVENOUS | Status: AC
  Administered 2021-09-04: 2.5 [IU]/h via INTRAVENOUS

## 2021-09-04 MED ORDER — CEFAZOLIN SODIUM-DEXTROSE 2-3 GM-%(50ML) IV SOLR
INTRAVENOUS | Status: DC | PRN
  Administered 2021-09-04: 2 g via INTRAVENOUS

## 2021-09-04 MED ORDER — KETOROLAC TROMETHAMINE 30 MG/ML IJ SOLN
INTRAMUSCULAR | Status: DC | PRN
  Administered 2021-09-04: 30 mg via INTRAVENOUS

## 2021-09-04 MED ORDER — MIDAZOLAM HCL 2 MG/2ML IJ SOLN
INTRAMUSCULAR | Status: DC | PRN
  Administered 2021-09-04: 2 mg via INTRAVENOUS

## 2021-09-04 MED ORDER — METHADONE HCL 5 MG PO TABS
85.0000 mg | ORAL_TABLET | Freq: Every day | ORAL | Status: AC
  Administered 2021-09-05 – 2021-09-06 (×2): 85 mg via ORAL
  Filled 2021-09-04 (×2): qty 1

## 2021-09-04 MED ORDER — LACTATED RINGERS IV SOLN: INTRAVENOUS | Status: DC | PRN

## 2021-09-04 MED ORDER — PHENYLEPHRINE HCL-NACL 20-0.9 MG/250ML-% IV SOLN
INTRAVENOUS | Status: DC | PRN
  Administered 2021-09-04: 50 ug/min via INTRAVENOUS

## 2021-09-04 MED ORDER — PHENYLEPHRINE HCL (PRESSORS) 10 MG/ML IV SOLN
INTRAVENOUS | Status: AC
  Filled 2021-09-04: qty 2

## 2021-09-04 MED ORDER — DIPHENHYDRAMINE HCL 25 MG PO CAPS: 25.0000 mg | ORAL_CAPSULE | ORAL | Status: DC | PRN

## 2021-09-04 MED ORDER — SENNOSIDES-DOCUSATE SODIUM 8.6-50 MG PO TABS
2.0000 | ORAL_TABLET | ORAL | Status: DC
  Administered 2021-09-05 – 2021-09-07 (×3): 2 via ORAL
  Filled 2021-09-04 (×3): qty 2

## 2021-09-04 MED ORDER — MENTHOL 3 MG MT LOZG
1.0000 | LOZENGE | OROMUCOSAL | Status: DC | PRN
  Filled 2021-09-04: qty 9

## 2021-09-04 MED ORDER — LACTATED RINGERS IV SOLN: Freq: Once | INTRAVENOUS | Status: AC

## 2021-09-04 MED ORDER — LIDOCAINE 5 % EX PTCH
MEDICATED_PATCH | CUTANEOUS | Status: AC
  Filled 2021-09-04: qty 1

## 2021-09-04 MED ORDER — MEPERIDINE HCL 25 MG/ML IJ SOLN: 6.2500 mg | INTRAMUSCULAR | Status: DC | PRN

## 2021-09-04 MED ORDER — MORPHINE SULFATE (PF) 0.5 MG/ML IJ SOLN
INTRAMUSCULAR | Status: AC
  Filled 2021-09-04: qty 10

## 2021-09-04 SURGICAL SUPPLY — 27 items
ADHESIVE MASTISOL STRL (MISCELLANEOUS) ×2 IMPLANT
BAG COUNTER SPONGE SURGICOUNT (BAG) ×2 IMPLANT
CHLORAPREP W/TINT 26 (MISCELLANEOUS) ×4 IMPLANT
DRSG TELFA 3X8 NADH (GAUZE/BANDAGES/DRESSINGS) ×2 IMPLANT
GAUZE SPONGE 4X4 12PLY STRL (GAUZE/BANDAGES/DRESSINGS) ×2 IMPLANT
GLOVE SURG POLY ORTHO LF SZ7.5 (GLOVE) ×2 IMPLANT
GOWN STRL REUS W/ TWL LRG LVL3 (GOWN DISPOSABLE) ×2 IMPLANT
GOWN STRL REUS W/TWL LRG LVL3 (GOWN DISPOSABLE) ×4
KIT TURNOVER KIT A (KITS) ×2 IMPLANT
MANIFOLD NEPTUNE II (INSTRUMENTS) ×2 IMPLANT
MAT PREVALON FULL STRYKER (MISCELLANEOUS) ×2 IMPLANT
NS IRRIG 1000ML POUR BTL (IV SOLUTION) ×2 IMPLANT
PACK C SECTION AR (MISCELLANEOUS) ×2 IMPLANT
PAD OB MATERNITY 4.3X12.25 (PERSONAL CARE ITEMS) ×2 IMPLANT
PAD PREP 24X41 OB/GYN DISP (PERSONAL CARE ITEMS) ×2 IMPLANT
PENCIL SMOKE EVACUATOR (MISCELLANEOUS) ×2 IMPLANT
RETRACTOR TRAXI PANNICULUS (MISCELLANEOUS) ×1 IMPLANT
RETRACTOR WND ALEXIS-O 25 LRG (MISCELLANEOUS) ×2 IMPLANT
RTRCTR WOUND ALEXIS O 25CM LRG (MISCELLANEOUS) ×4
SCRUB EXIDINE 4% CHG 4OZ (MISCELLANEOUS) ×2 IMPLANT
SPONGE T-LAP 18X18 ~~LOC~~+RFID (SPONGE) ×2 IMPLANT
SUT VIC AB 0 CTX 36 (SUTURE) ×4
SUT VIC AB 0 CTX36XBRD ANBCTRL (SUTURE) ×2 IMPLANT
SUT VIC AB 1 CT1 36 (SUTURE) ×4 IMPLANT
SUT VICRYL+ 3-0 36IN CT-1 (SUTURE) ×4 IMPLANT
TRAXI PANNICULUS RETRACTOR (MISCELLANEOUS) ×1
WATER STERILE IRR 500ML POUR (IV SOLUTION) ×2 IMPLANT

## 2021-09-04 NOTE — H&P (Signed)
History and Physical   HPI  Shirley Black is a 25 y.o. G2P1001 at [redacted]w[redacted]d Estimated Date of Delivery: 09/08/21 who is being admitted for C-section - elective repeat.   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   History of macrosomia in infant in prior pregnancy, currently pregnant 07/11/2021   History of gestational hypertension 04/13/2017   Hepatitis C carrier (HCC) 04/13/2017   Supervision of high risk pregnancy in third trimester 04/13/2017    Prenatal labs and studies: ABO, Rh: --/--/A POS (11/18 0919) Antibody: NEG (11/18 0919) Rubella: <0.90 (05/31 1414) RPR: Non Reactive (09/09 1018)  HBsAg: Negative (05/31 1414)  HIV: Non Reactive (05/31 1414)  [birth year]2003-- (10/26 1142)   Past Medical History:  Diagnosis Date   Anxiety    Asthma    Depression    GERD (gastroesophageal reflux disease)    Hepatitis C    treated in 2018 now undetectable     Past Surgical History:  Procedure Laterality Date   CESAREAN SECTION N/A 04/14/2017   Procedure: CESAREAN SECTION;  Surgeon: 06/15/2017, MD;  Location: [facility] ORS;  Service: Obstetrics;  Laterality: N/A;   TONSILECTOMY, ADENOIDECTOMY, BILATERAL MYRINGOTOMY AND TUBES     TONSILLECTOMY       Medications    Current Discharge Medication List     CONTINUE these medications which have NOT CHANGED   Details  albuterol (VENTOLIN HFA) 108 (90 Base) MCG/ACT inhaler Inhale 2 puffs into the lungs every 6 (six) hours  as needed for wheezing or shortness of breath. Qty: 1 each, Refills: 2    calcium carbonate (TUMS - DOSED IN MG ELEMENTAL CALCIUM) 500 MG chewable tablet Chew 1 tablet by mouth daily.    methadone (DOLOPHINE) 10 MG/ML solution Take 95 mg by mouth daily.    ondansetron (ZOFRAN-ODT) 4 MG disintegrating tablet Take 1 tablet (4 mg total) by mouth every 8 (eight) hours as needed for nausea or vomiting. Qty: 30 tablet, Refills: 1    Prenatal Vit-Fe Fumarate-FA (PRENATAL MULTIVITAMIN) TABS tablet Take 2 tablets by mouth daily at 12 noon.         Allergies  Latex and Adhesive [tape]  Review of Systems  Pertinent items noted in HPI and remainder of comprehensive ROS otherwise negative.  Physical Exam  BP 121/70 (BP Location: Left Arm)   Pulse 81   Temp 98 F (36.7 C) (Oral)   Resp 14   Ht 5\' 5"  (1.651 m)   Wt 106.6 kg   LMP 12/02/2020   BMI 39.11 kg/m   Lungs:  CTA B Cardio: RRR without M/R/G Abd: Soft, gravid, NT Presentation: cephalic EXT: No C/C/ 1+ Edema DTRs: 2+ B CERVIX:    See Prenatal records for more detailed PE.     FHR:  Variability: Good {> 6 bpm)  Test Results  No results  found for this or any previous visit (from the past 24 hour(s)).   Assessment   G2P1001 at [redacted]w[redacted]d Estimated Date of Delivery: 09/08/21  The fetus is reassuring.   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 Admit to L&D :   2. EFM: -- Category 1 3. CD   Elonda Husky, M.D. 09/04/2021 7:46 AM

## 2021-09-04 NOTE — Op Note (Signed)
      OP NOTE  Date: 09/04/2021   10:00 AM Name Shirley Black MR# [id number]  Preoperative Diagnosis: 1. Intrauterine pregnancy at [redacted]w[redacted]d Principal Problem:   Term pregnancy    Obesity    Methadone maintenance History of cesarean delivery for macrosomia Patient desires repeat cesarean delivery         Postoperative Diagnosis: 1. Intrauterine pregnancy at [redacted]w[redacted]d, delivered 2. Viable infant 3. Remainder same as pre-op   Procedure: 1.  Repeat Low-Transverse Cesarean Section  Surgeon: Elonda Husky, MD  Assistant:  Valentino Saxon MD .  No other capable assistant was available for this surgery which requires an experienced, high level assistant.  She provided exposure, dissection, suctioning, retraction, and general support and assistance during the procedure.   Anesthesia: Spinal    EBL: 490  ml     Findings: 1) female infant, Apgar scores of 8   at 1 minute and 9   at 5 minutes and a birthweight of   ounces.    2) Normal uterus, tubes and ovaries.    Procedure:  The patient was prepped and draped in the supine position and placed under spinal anesthesia.  A transverse incision was made across the abdomen in a Pfannenstiel manner. If indicated the old scar was systematically removed with sharp dissection.  We carried the dissection down to the level of the fascia.  The fascia was incised in a curvilinear manner.  The fascia was then elevated from the rectus muscles with blunt and sharp dissection.  The rectus muscles were separated laterally exposing the peritoneum.  The peritoneum was carefully entered with care being taken to avoid bowel and bladder.  A self-retaining retractor was placed.  The visceral peritoneum was incised in a curvilinear fashion across the lower uterine segment creating a bladder flap. A transverse incision was made across the lower uterine segment and extended laterally and superiorly using the bandage scissors.  Artificial rupture membranes was  performed and Clear fluid was noted.  The infant was delivered from the cephalic position.  A nuchal cord was not present. After an appropriate time interval, the cord was doubly clamped and cut. Cord blood was obtained if required.  The infant was handed to the pediatric personnel  who then placed the infant under heat lamps where it was cleaned dried and suctioned as needed. The placenta was delivered. The hysterotomy incision was then identified on ring forceps.  The uterine cavity was cleaned with a moist lap sponge.  The hysterotomy incision was closed with a running interlocking suture of Vicryl.  Hemostasis was excellent.  Pitocin was run in the IV and the uterus was found to be firm. The posterior cul-de-sac and gutters were cleaned and inspected.  Hemostasis was noted.  The fascia was then closed with a running suture of #1 Vicryl.  Hemostasis of the subcutaneous tissues was obtained using the Bovie.  The subcutaneous tissues were closed with a running suture of 000 Vicryl.  A subcuticular suture was placed.  Derma bond was applied in the usual manner.  A pressure dressing was placed.  The patient went to the recovery room in stable condition.   Elonda Husky, M.D. 09/04/2021 10:00 AM

## 2021-09-04 NOTE — Anesthesia Procedure Notes (Signed)
Spinal  Patient location during procedure: OR Start time: 09/04/2021 7:54 AM End time: 09/04/2021 7:58 AM Reason for block: surgical anesthesia Staffing Performed: resident/CRNA  Anesthesiologist: Foye Deer, MD Resident/CRNA: Lynden Oxford, CRNA Preanesthetic Checklist Completed: patient identified, IV checked, site marked, risks and benefits discussed, surgical consent, monitors and equipment checked, pre-op evaluation and timeout performed Spinal Block Patient position: sitting Prep: DuraPrep Patient monitoring: heart rate, cardiac monitor, continuous pulse ox and blood pressure Approach: midline Location: L3-4 Injection technique: single-shot Needle Needle type: Pencan  Needle gauge: 25 G Needle length: 9 cm Assessment Sensory level: T4 Events: CSF return

## 2021-09-04 NOTE — Lactation Note (Signed)
This note was copied from a baby's chart. Lactation Consultation Note  Patient Name: Shirley Black ZOXWR'U Date: 09/04/2021 Reason for consult: Follow-up assessment;NICU baby;Term;Other (Comment) (c-section, thrush) Age:24 hours  Lactation follow-up. Baby has been moved to SCN due to grunting. Baby will continue "transitioning" while being monitored in SCN. Mom desires to limit exposure to bottle and formula if possible. DEBP set-up at bedside. Mom educated on use, cleaning, transport, and frequency.  LC assisted with first pumping session and fitting of appropriate flange at this time (size 77mm).  Various feeding methods were discussed as mom wishes to avoid exposure to bottle nipples and formula. LC touched based with assigned RN in SCN about finger/syringe feeding with any EBM, and other means of nutrition if possible.  Mom plans to be able to be present for next feeding attempt around 5-5:30 if possible pending how comfortable her RN and MD are with early moving following c-section. LC to continue to check in as needed.  Maternal Data Has patient been taught Hand Expression?: Yes Does the patient have breastfeeding experience prior to this delivery?: Yes  Feeding Mother's Current Feeding Choice: Breast Milk  LATCH Score Latch: Too sleepy or reluctant, no latch achieved, no sucking elicited.                  Lactation Tools Discussed/Used Tools: Pump Breast pump type: Double-Electric Breast Pump Pump Education: Setup, frequency, and cleaning;Milk Storage Reason for Pumping: baby admitted to SCN Pumping frequency: q 2-3 hours  Interventions Interventions: DEBP;Hand express  Discharge Pump: Personal (Spectra)  Consult Status Consult Status: Follow-up Date: 09/04/21 Follow-up type: In-patient    Danford Bad 09/04/2021, 2:37 PM

## 2021-09-04 NOTE — Transfer of Care (Signed)
Immediate Anesthesia Transfer of Care Note  Patient: Shirley Black  Procedure(s) Performed: CESAREAN SECTION  Patient Location: L & D  Anesthesia Type:Spinal  Level of Consciousness: awake and drowsy  Airway & Oxygen Therapy: Patient Spontanous Breathing  Post-op Assessment: Report given to RN and Post -op Vital signs reviewed and stable  Post vital signs: Reviewed and stable  Last Vitals:  Vitals Value Taken Time  BP 124/76 (92)   Temp 97.7 F   Pulse 69   Resp 11   SpO2 97 %     Last Pain:  Vitals:   09/04/21 0655  TempSrc: Oral  PainSc:          Complications: No notable events documented.

## 2021-09-04 NOTE — Lactation Note (Signed)
This note was copied from a baby's chart. Lactation Consultation Note  Patient Name: Shirley Black WCHEN'I Date: 09/04/2021 Reason for consult: L&D Initial assessment;Term;Other (Comment) (repeat c/s, mom has thrush) Age:24 hours  Initial lactation assessment done in L&D, 1.5hr post repeat c/s delivery. This is mom's second baby and she desires to breastfeed. Mom reports having been dx w/ thrush and battling it for the last 5 months. Mom also developed thrush with her first child which led to discontinuing breastfeeding. She is concerned about this baby also getting thrush. We discussed the importance of treatment, personal hygiene and careful hand hygiene to aid in not developing thrust on or around the breast/nipple/areola. LC encouraged breastfeeding as having the colostrum is beneficial to baby with the understanding that there will be a chance of developing thrush.  LC and mom worked together to position baby in cross-cradle hold at the left breast. LC was able to easily hand express colostrum onto the tip of the nipple; however the baby did not attempt to feed or even open mouth, was sleepy and uninterested. LC moved baby to mom's chest skin to skin and encouraged continued frequent tries at the breast, and reviewed options of spoon feed/hand expression as alternate means until baby becomes interested.   LC followed up at 11:30- again no feedings since delivery, baby swaddled and sleeping soundly with grandma. Mom is in bed without covers, laying flat, reporting significant pain and discomfort; RN aware per mom and dad. Encouraged to focus on resting/relief of pain at this time since baby was not cuing, LC to follow-up once moved to [facility].  Maternal Data Has patient been taught Hand Expression?: Yes Does the patient have breastfeeding experience prior to this delivery?: Yes  Feeding Mother's Current Feeding Choice: Breast Milk  LATCH Score Latch: Too sleepy or reluctant, no latch  achieved, no sucking elicited.                  Lactation Tools Discussed/Used    Interventions Interventions: Breast feeding basics reviewed;Assisted with latch;Hand express;Position options;Education  Discharge    Consult Status Consult Status: Follow-up from L&D    Danford Bad 09/04/2021, 11:38 AM

## 2021-09-04 NOTE — Anesthesia Preprocedure Evaluation (Addendum)
Anesthesia Evaluation  Patient identified by MRN, date of birth, ID band Patient awake    Reviewed: Allergy & Precautions, H&P , NPO status , Patient's Chart, lab work & pertinent test results, reviewed documented beta blocker date and time   History of Anesthesia Complications Negative for: history of anesthetic complications  Airway Mallampati: III  TM Distance: >3 FB Neck ROM: full    Dental  (+) Missing,    Pulmonary neg shortness of breath, asthma (controlled) , Recent URI  (uses albuterol prophylactially at night since the URI), Resolved, Current Smoker and Patient abstained from smoking.,    Pulmonary exam normal        Cardiovascular Exercise Tolerance: Good negative cardio ROS   Rhythm:Regular Rate:Normal     Neuro/Psych PSYCHIATRIC DISORDERS (Depression) Depression negative neurological ROS     GI/Hepatic GERD  ,(+)     substance abuse (prescribed methadone 95 mg daily)  , Hepatitis -, CTreated in 2018 now undetectable   Endo/Other  neg diabetesMorbid obesity  Renal/GU negative Renal ROS  negative genitourinary   Musculoskeletal  (+) narcotic dependentPt endorses being pigeon toed   Abdominal (+) + obese, gravid  Peds  Hematology negative hematology ROS (+)   Anesthesia Other Findings term pregnancy, desires repeat  Obese  Past Medical History: No date: Anxiety No date: Asthma No date: Depression No date: Hepatitis C   Reproductive/Obstetrics (+) Pregnancy                            Anesthesia Physical  Anesthesia Plan  ASA: III  Anesthesia Plan: Spinal   Post-op Pain Management:    Induction:   PONV Risk Score and Plan:   Airway Management Planned:   Additional Equipment:   Intra-op Plan:   Post-operative Plan:   Informed Consent: I have reviewed the patients History and Physical, chart, labs and discussed the procedure including the risks, benefits  and alternatives for the proposed anesthesia with the patient or authorized representative who has indicated his/her understanding and acceptance.     Dental Advisory Given and Dental advisory given  Plan Discussed with: Anesthesiologist, CRNA and Surgeon  Anesthesia Plan Comments:        Anesthesia Quick Evaluation

## 2021-09-05 ENCOUNTER — Encounter: Payer: Self-pay | Admitting: Obstetrics and Gynecology

## 2021-09-05 LAB — TYPE AND SCREEN
ABO/RH(D): A POS
Antibody Screen: NEGATIVE
Extend sample reason: UNDETERMINED

## 2021-09-05 MED ORDER — IBUPROFEN 600 MG PO TABS
600.0000 mg | ORAL_TABLET | Freq: Four times a day (QID) | ORAL | Status: DC
  Administered 2021-09-05 – 2021-09-07 (×6): 600 mg via ORAL
  Filled 2021-09-05 (×6): qty 1

## 2021-09-05 NOTE — Anesthesia Post-op Follow-up Note (Signed)
  Anesthesia Pain Follow-up Note  Patient: Shirley Black  Day #: 1  Date of Follow-up: 09/05/2021 Time: 12:10 PM  Last Vitals:  Vitals:   09/05/21 0920 09/05/21 1137  BP: 122/68 128/82  Pulse: 75 82  Resp: 20 20  Temp: 36.9 C 36.8 C  SpO2: 98%     Level of Consciousness: alert  Pain: none   Side Effects:None  Catheter Site Exam:clean, dry, no drainage     Plan: D/C from anesthesia care at surgeon's request  Elmarie Mainland

## 2021-09-05 NOTE — Anesthesia Postprocedure Evaluation (Signed)
Anesthesia Post Note  Patient: Shirley Black  Procedure(s) Performed: CESAREAN SECTION  Patient location during evaluation: Mother Baby Anesthesia Type: Spinal Level of consciousness: oriented and awake and alert Pain management: pain level controlled Vital Signs Assessment: post-procedure vital signs reviewed and stable Respiratory status: spontaneous breathing and respiratory function stable Cardiovascular status: blood pressure returned to baseline and stable Postop Assessment: no headache, no backache, no apparent nausea or vomiting and able to ambulate Anesthetic complications: no   No notable events documented.   Last Vitals:  Vitals:   09/05/21 0920 09/05/21 1137  BP: 122/68 128/82  Pulse: 75 82  Resp: 20 20  Temp: 36.9 C 36.8 C  SpO2: 98%     Last Pain:  Vitals:   09/05/21 1137  TempSrc: Oral  PainSc:                  Elmarie Mainland

## 2021-09-05 NOTE — Lactation Note (Signed)
This note was copied from a baby's chart. Lactation Consultation Note  Patient Name: Shirley Black ASTMH'D Date: 09/05/2021   Age:24 hours  Lactation follow-up with parents. Parents requested donor breastmilk via bottle for the latest feeding and mom also provided some of her EBM (67mL).  Initially there was difficulty getting baby to complete his feeding via bottle with small amount of 68mL taken (67ml EBM+34mL DBM).  LC worked with baby in room in sitting/side lying with head/neck/chin support. With breaks, baby was able to consume the rest of the warmed DBM w/ total intake of 53mL over the course of 1 hour.  LC discussed with parents the need for more aggressive feeding with positional support of baby and frequent burping. We discussed goal volumes of 15+ with each of the next feedings. Goal would be to be at 20-25 overnight with feedings. Mom verbalized understanding with position of baby while feeding and increase in volumes.  Maternal Data    Feeding    LATCH Score                    Lactation Tools Discussed/Used    Interventions    Discharge    Consult Status      Danford Bad 09/05/2021, 4:00 PM

## 2021-09-05 NOTE — TOC Initial Note (Signed)
Transition of Care [facility]) - Initial/Assessment Note    Patient Details  Name: Shirley Black MRN: [id number] Date of Birth: 11-18-1995  [facility] CM/SW Contact:    [place] Cellar, RN Phone Number: 09/05/2021, 3:41 PM  Clinical Narrative:                 Spoke with patient and FOB at bedside. Infant is out of SCN and rooming in with mom. Patient is hopeful for discharge home tomorrow and infant will discharge home Friday.  MOB was aware of need for infant to stay due to Methadone use. Reports she remains compliant with Methadone clinic and has a 1-1 therapist for assistance as needed. Methadone clinic is aware of delivery and following closely. Has 24 year old child in the home and strong family support. Discussed PPD and importance of hand hygiene. Patient reports she is looking forward to breastfeeding although infant is not latching quite yet. Patient is pumping and using donor milk as needed. No current needs or concerns. Has all needed equipment.         Patient Goals and CMS Choice        Expected Discharge Plan and Services                                                Prior Living Arrangements/Services                       Activities of Daily Living Home Assistive Devices/Equipment: None ADL Screening (condition at time of admission) Patient's cognitive ability adequate to safely complete daily activities?: Yes Is the patient deaf or have difficulty hearing?: No Does the patient have difficulty seeing, even when wearing glasses/contacts?: No Does the patient have difficulty concentrating, remembering, or making decisions?: No Patient able to express need for assistance with ADLs?: Yes Does the patient have difficulty dressing or bathing?: No Independently performs ADLs?: Yes (appropriate for developmental age) Does the patient have difficulty walking or climbing stairs?: No Weakness of Legs: None Weakness of Arms/Hands:  None  Permission Sought/Granted                  Emotional Assessment              Admission diagnosis:  Term pregnancy [Z34.90] Patient Active Problem List   Diagnosis Date Noted   Term pregnancy 09/04/2021   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   History of macrosomia in infant in prior pregnancy, currently pregnant 07/11/2021   History of gestational hypertension 04/13/2017   Hepatitis C carrier (HCC) 04/13/2017   Supervision of high risk pregnancy in third trimester 04/13/2017   PCP:  Dione Housekeeper, MD Pharmacy:   [facility] [phone] - [place], [place] - [address] AT [facility] [address] [place] [address] Phone: [phone] Fax: [phone]     Social Determinants of Health (SDOH) Interventions    Readmission Risk Interventions No flowsheet data found.

## 2021-09-05 NOTE — Lactation Note (Signed)
This note was copied from a baby's chart. Lactation Consultation Note  Patient Name: Shirley Black EPPIR'J Date: 09/05/2021   Age:24 hours  Lactation was at bedside for the 1800 feeding on 09/04/21 with mom still in hospital bed. Multiple attempts made for baby to latch without tools/support.  LC adjusted position of pillows and mom/baby in football hold, tea cup hold for breast tissue, and expression of colostrum did not encourage a sustained latch. Size 27mm NS was placed baby did accept well and would suckle on/off.  LC put syringe inside shield and baby began to stay at the breast. Intermittent periods of independent suckling without supplement, with total of DBM given of 2.71mL over total of 16 minute feeding at the breast.  Mom was encouraged once back in her room to pump, be available for feedings throughout the night, and continue with pumping post feedings or minimum every 3 hours.  Maternal Data    Feeding Nipple Type: Dr. Lorne Skeens  [facility] Score                    Lactation Tools Discussed/Used    Interventions    Discharge    Consult Status      Danford Bad 09/05/2021, 3:18 PM

## 2021-09-05 NOTE — Progress Notes (Signed)
Progress Note - Cesarean Delivery  Shirley Black is a 25 y.o. G2P2002 now PP day 1 s/p C-Section, Low Transverse .   Subjective:  Patient reports no problems with eating, bowel movements, voiding, or their wound  Pain controlled  OOB without problem   Objective:  Vital signs in last 24 hours: Temp:  [97.8 F (36.6 C)-98.4 F (36.9 C)] 97.8 F (36.6 C) (11/22 0353) Pulse Rate:  [63-102] 68 (11/22 0353) Resp:  [10-22] 18 (11/22 0353) BP: (107-126)/(55-91) 125/73 (11/22 0353) SpO2:  [95 %-99 %] 97 % (11/22 0353)  Physical Exam:  General: alert, cooperative, and no distress Lochia: appropriate Uterine Fundus: firm Incision: dressing intact    Data Review No results for input(s): HGB, HCT in the last 72 hours.  Assessment:  Principal Problem:   Term pregnancy   Status post Cesarean section. Doing well postoperatively.     Plan:       Continue current care.  May shower  Dressing change to honeycomb  Encourage breast feeding  Elonda Husky, M.D. 09/05/2021 7:14 AM

## 2021-09-06 DIAGNOSIS — B370 Candidal stomatitis: Secondary | ICD-10-CM

## 2021-09-06 DIAGNOSIS — Z3490 Encounter for supervision of normal pregnancy, unspecified, unspecified trimester: Secondary | ICD-10-CM

## 2021-09-06 NOTE — Lactation Note (Signed)
This note was copied from a baby's chart. Lactation Consultation Note  Patient Name: Shirley Black Date: 09/06/2021 Reason for consult: Follow-up assessment;Term;Other (Comment) (Eat Sleep Console) Age:24 hours  Follow-up with family. Baby continues to receive DBM via bottle, increase in volume, tolerating well. Mom has continued to pump every 3 hours and reports increasing volume; last pump session she received 50mL from 48mL yesterday. Encouraged continued feeding patterns, care and behavior. Will call out for support if needed.  Maternal Data Has patient been taught Hand Expression?: Yes  Feeding Mother's Current Feeding Choice: Breast Milk Nipple Type: Dr. Lorne Skeens  [facility] Score                    Lactation Tools Discussed/Used Tools: Pump Breast pump type: Double-Electric Breast Pump Pump Education: Setup, frequency, and cleaning;Milk Storage Reason for Pumping: ESC Pumping frequency: q 3 hours  Interventions Interventions: Breast feeding basics reviewed;Education;Support pillows  Discharge    Consult Status Consult Status: PRN    Danford Bad 09/06/2021, 9:44 AM

## 2021-09-06 NOTE — Consult Note (Signed)
NAME: Shirley Black  DOB: 1996-03-01  MRN: [id number]  Date/Time: 09/06/2021 8:34 PM  REQUESTING PROVIDER: Dr.Evans Subjective:  REASON FOR CONSULT: Oral candidiasis ?limited history from patient due to her being occupied with new born and also going to visit her 24 yr old child in the car downstairs. Family was in the room which prevented me from asking some questions related to drug use/sexual hisotry Shirley Cydnee Jerilynn Mages Wilkerson is a 25 y.o. female with a history of treated [place] in Dec/Jan 2018 with undetectable Vl last done 03/14/21 was admitted for csection and underwent surgery on 09/04/21. I am asked to see her as she has been having white tongue for 6 months and has been treated repeatedly for yeast with diflucan last Hiv test was 03/14/21   Past Medical History:  Diagnosis Date   Anxiety    Asthma    Depression    GERD (gastroesophageal reflux disease)    Hepatitis C    treated in 2018 now undetectable    Past Surgical History:  Procedure Laterality Date   CESAREAN SECTION N/A 04/14/2017   Procedure: CESAREAN SECTION;  Surgeon: Benjaman Kindler, MD;  Location: [facility] ORS;  Service: Obstetrics;  Laterality: N/A;   CESAREAN SECTION N/A 09/04/2021   Procedure: CESAREAN SECTION;  Surgeon: Harlin Heys, MD;  Location: [facility] ORS;  Service: Obstetrics;  Laterality: N/A;   TONSILECTOMY, ADENOIDECTOMY, BILATERAL MYRINGOTOMY AND TUBES     TONSILLECTOMY      Social History   Socioeconomic History   Marital status: Single    Spouse name: Not on file   Number of children: Not on file   Years of education: Not on file   Highest education level: Not on file  Occupational History   Not on file  Tobacco Use   Smoking status: Every Day    Packs/day: 0.25    Types: Cigarettes   Smokeless tobacco: Never  Vaping Use   Vaping Use: Every day   Substances: Nicotine, Flavoring  Substance and Sexual Activity   Alcohol use: No   Drug use: No    Comment: Methodone   Sexual activity:  Yes  Other Topics Concern   Not on file  Social History Narrative   Not on file   Social Determinants of Health   Financial Resource Strain: Not on file  Food Insecurity: Not on file  Transportation Needs: Not on file  Physical Activity: Not on file  Stress: Not on file  Social Connections: Not on file  Intimate Partner Violence: Not on file    Family History  Problem Relation Age of Onset   Depression Mother    Diabetes Mother    Asthma Father    Depression Father    Depression Sister    Cancer Maternal Grandmother    Cancer Paternal Grandmother    Allergies  Allergen Reactions   Latex Hives   Adhesive [Tape] Rash   I? Current Facility-Administered Medications  Medication Dose Route Frequency Provider Last Rate Last Admin   diphenhydrAMINE (BENADRYL) injection 12.5 mg  12.5 mg Intravenous Q4H PRN [place] Ouch, MD       Or   diphenhydrAMINE (BENADRYL) capsule 25 mg  25 mg Oral Q4H PRN [place] Ouch, MD       diphenhydrAMINE (BENADRYL) capsule 25 mg  25 mg Oral Q6H PRN Harlin Heys, MD       hydrOXYzine (ATARAX/VISTARIL) tablet 25 mg  25 mg Oral TID Harlin Heys, MD   25  mg at 09/06/21 1759   ibuprofen (ADVIL) tablet 600 mg  600 mg Oral Q6H Tressie Ellis, RPH   600 mg at 09/06/21 1759   lactated ringers infusion   Intravenous Continuous Linzie Collin, MD   Stopped at 09/04/21 2000   menthol-cetylpyridinium (CEPACOL) lozenge 3 mg  1 lozenge Oral Q2H PRN Linzie Collin, MD       nalbuphine (NUBAIN) injection 5 mg  5 mg Intravenous Q4H PRN Foye Deer, MD       Or   nalbuphine (NUBAIN) injection 5 mg  5 mg Subcutaneous Q4H PRN Foye Deer, MD       naloxone [facility]) injection 0.4 mg  0.4 mg Intravenous PRN Foye Deer, MD       And   sodium chloride flush (NS) 0.9 % injection 3 mL  3 mL Intravenous PRN Foye Deer, MD       ondansetron [facility]) injection 4 mg  4 mg Intravenous Q8H PRN  Foye Deer, MD       oxyCODONE (Oxy IR/ROXICODONE) immediate release tablet 5 mg  5 mg Oral Q4H PRN Foye Deer, MD   5 mg at 09/04/21 9983   oxyCODONE-acetaminophen (PERCOCET/ROXICET) 5-325 MG per tablet 1-2 tablet  1-2 tablet Oral Q4H PRN Linzie Collin, MD   2 tablet at 09/06/21 1553   prenatal multivitamin tablet 1 tablet  1 tablet Oral Q1200 Linzie Collin, MD   1 tablet at 09/06/21 1212   senna-docusate (Senokot-S) tablet 2 tablet  2 tablet Oral Q24H Linzie Collin, MD   2 tablet at 09/06/21 0919   simethicone (MYLICON) chewable tablet 80 mg  80 mg Oral QID Linzie Collin, MD   80 mg at 09/06/21 1759   zolpidem (AMBIEN) tablet 5 mg  5 mg Oral QHS PRN Linzie Collin, MD         Abtx:  Anti-infectives (From admission, onward)    Start     Dose/Rate Route Frequency Ordered Stop   09/04/21 0615  ceFAZolin (ANCEF) IVPB 2g/100 mL premix  Status:  Discontinued        2 g 200 mL/hr over 30 Minutes Intravenous On call to O.R. 09/04/21 3825 09/04/21 0820   09/04/21 0615  ceFAZolin (ANCEF) IVPB 3g/100 mL premix  Status:  Discontinued        3 g 200 mL/hr over 30 Minutes Intravenous On call to O.R. 09/04/21 0608 09/04/21 0610       REVIEW OF SYSTEMS:  Const: negative fever, negative chills, negative weight loss Eyes: negative diplopia or visual changes, negative eye pain ENT: negative coryza, negative sore throat Resp: negative cough, hemoptysis, dyspnea Cards: negative for chest pain, palpitations, lower extremity edema GU: negative for frequency, dysuria and hematuria GI: pain abdomen from surgery Skin: negative for rash and pruritus Heme: negative for easy bruising and gum/nose bleeding MS: negative for myalgias, arthralgias, back pain and muscle weakness Neurolo:negative for headaches, dizziness, vertigo, memory problems  Psych:  anxiety, depression  Endocrine: negative for thyroid, diabetes Allergy/Immunology- negative for any medication  or food allergies  Objective:  VITALS:  BP 117/60 (BP Location: Left Arm)   Pulse 78   Temp 97.9 F (36.6 C) (Oral)   Resp 20   Ht 5\' 5"  (1.651 m)   Wt 106.6 kg   LMP 12/02/2020   SpO2 98%   Breastfeeding Unknown   BMI 39.11 kg/m  PHYSICAL EXAM:  Limited examination General: Alert, cooperative,  no distress, appears stated age.  Head: Normocephalic, without obvious abnormality, atraumatic. Eyes: Conjunctivae clear, anicteric sclerae. Pupils are equal ENT Nares normal. No drainage or sinus tenderness. Lips, mucosa, and tongue normal. No Thrush   Neck: Supple, symmetrical, no adenopathy, thyroid: non tender no carotid bruit and no JVD. Back: No CVA tenderness. Lungs: Clear to auscultation bilaterally. No Wheezing or Rhonchi. No rales. Heart: Regular rate and rhythm, no murmur, rub or gallop. Abdomen: did not examine Extremities: atraumatic, no cyanosis. No edema. No clubbing Skin: limited examination Neurologic: Grossly non-focal Pertinent Labs Lab Results CBC    Component Value Date/Time   WBC 9.5 09/01/2021 0919   RBC 3.72 (L) 09/01/2021 0919   HGB 11.2 (L) 09/01/2021 0919   HGB 11.2 06/23/2021 1018   HCT 34.1 (L) 09/01/2021 0919   HCT 33.0 (L) 06/23/2021 1018   PLT 226 09/01/2021 0919   PLT 204 06/23/2021 1018   MCV 91.7 09/01/2021 0919   MCV 94 06/23/2021 1018   MCH 30.1 09/01/2021 0919   MCHC 32.8 09/01/2021 0919   RDW 13.5 09/01/2021 0919   RDW 13.3 06/23/2021 1018   LYMPHSABS 1.7 03/14/2021 1414   EOSABS 0.1 03/14/2021 1414   BASOSABS 0.0 03/14/2021 1414    CMP Latest Ref Rng & Units 05/14/2017 04/22/2017 04/13/2017  Glucose 65 - 99 mg/dL 98 103(H) 80  BUN 6 - 20 mg/dL 16 14 7   Creatinine 0.44 - 1.00 mg/dL 0.77 0.78 0.49  Sodium 135 - 145 mmol/L 141 139 136  Potassium 3.5 - 5.1 mmol/L 4.3 3.9 3.9  Chloride 101 - 111 mmol/L 108 106 106  CO2 22 - 32 mmol/L 23 24 22   Calcium 8.9 - 10.3 mg/dL 9.6 9.0 9.1  Total Protein 6.5 - 8.1 g/dL 8.2(H) - 7.4  Total  Bilirubin 0.3 - 1.2 mg/dL 0.5 - 0.4  Alkaline Phos 38 - 126 U/L 174(H) - 230(H)  AST 15 - 41 U/L 44(H) - 32  ALT 14 - 54 U/L 43 - 36      Microbiology: Recent Results (from the past 240 hour(s))  SARS CORONAVIRUS 2 (TAT 6-24 HRS) Nasopharyngeal Nasopharyngeal Swab     Status: None   Collection Time: 09/01/21  9:24 AM   Specimen: Nasopharyngeal Swab  Result Value Ref Range Status   SARS Coronavirus 2 NEGATIVE NEGATIVE Final    Comment: (NOTE) SARS-CoV-2 target nucleic acids are NOT DETECTED.  The SARS-CoV-2 RNA is generally detectable in upper and lower respiratory specimens during the acute phase of infection. Negative results do not preclude SARS-CoV-2 infection, do not rule out co-infections with other pathogens, and should not be used as the sole basis for treatment or other patient management decisions. Negative results must be combined with clinical observations, patient history, and epidemiological information. The expected result is Negative.  Fact Sheet for Patients: SugarRoll.be  Fact Sheet for Healthcare Providers: https://www.woods-mathews.com/  This test is not yet approved or cleared by the [place] FDA and  has been authorized for detection and/or diagnosis of SARS-CoV-2 by FDA under an Emergency Use Authorization (EUA). This EUA will remain  in effect (meaning this test can be used) for the duration of the COVID-19 declaration under Se ction 564(b)(1) of the Act, 21 U.S.C. section 360bbb-3(b)(1), unless the authorization is terminated or revoked sooner.  Performed at [facility], [place] [address], [place] [address]    ? Impression/Recommendation ? ?I was asked to see the patient for repeated white coating of the tongue On  examination today the mouth was totally clear of any white lesions . Pt said she cleaned her tongue and hence the white coating is gone' Explained to her that yeast  infection/oral thrush cannot be scraped away with a tongue cleaner- so this is unlikely to be yeast infection. She does not need any treatment Pt had HIV test during her fist trimester and she has delivered now. Would recommend doing another HIV test now  H/O Treated [facility]PC- Undetectable Vl in may 2022   Discussed the management with aptient and Dr.Evans  ?ID will sign off-call if needed  Note:  This document was prepared using Dragon voice recognition software and may include unintentional dictation errors.

## 2021-09-06 NOTE — Progress Notes (Signed)
Progress Note - Cesarean Delivery  Shirley Black is a 25 y.o. [phone] now PP day 2 s/p C-Section, Low Transverse .   Subjective:  Patient reports no problems with eating, bowel movements, voiding, or their wound  Pain generally controlled.  She says she is doing better with the cesarean delivery them with her last period  She is breast-feeding and pumping.  Baby on eat sleep console protocol  Objective:  Vital signs in last 24 hours: Temp:  [97.8 F (36.6 C)-98.4 F (36.9 C)] 97.9 F (36.6 C) (11/23 0821) Pulse Rate:  [71-86] 78 (11/23 0821) Resp:  [18-20] 20 (11/23 0821) BP: (118-134)/(62-86) 123/78 (11/23 0821) SpO2:  [97 %-98 %] 97 % (11/23 1157)  Physical Exam:  General: alert, cooperative, and no distress Lochia: appropriate Uterine Fundus: firm     Data Review No results for input(s): HGB, HCT in the last 72 hours.  Assessment:  Principal Problem:   Term pregnancy   Status post Cesarean section. Doing well postoperatively.     Plan:       Continue current care.  Patient would like to stay 1 more day because her baby has to stay.  Plan for discharge tomorrow and then moving in with eat sleep console.  Patient advised to begin to wean from narcotics for pain relief.  Patient desires ID consult for recurrent oral thrush despite antifungals.  Elonda Husky, M.D. 09/06/2021 9:02 AM

## 2021-09-07 DIAGNOSIS — O99314 Alcohol use complicating childbirth: Secondary | ICD-10-CM

## 2021-09-07 MED ORDER — OXYCODONE-ACETAMINOPHEN 5-325 MG PO TABS: 1.0000 | ORAL_TABLET | ORAL | 0 refills | Status: DC | PRN

## 2021-09-07 MED ORDER — METHADONE HCL 5 MG PO TABS
85.0000 mg | ORAL_TABLET | Freq: Every day | ORAL | Status: AC
  Administered 2021-09-07: 85 mg via ORAL
  Filled 2021-09-07: qty 1

## 2021-09-07 MED ORDER — IBUPROFEN 600 MG PO TABS: 600.0000 mg | ORAL_TABLET | Freq: Four times a day (QID) | ORAL | 0 refills | Status: DC

## 2021-09-07 NOTE — Progress Notes (Signed)
Patient discharged home. Infant still an ESC patient in hospital. Discharge instructions and prescriptions given and reviewed with patient. Patient verbalized understanding.  Encouraged pt to call and schedule a follow-up appointment with Dr. Logan Bores in 1-week at [facility].   Explained importance of incision care.   Patient staying to room-in with infant.

## 2021-09-07 NOTE — Lactation Note (Signed)
This note was copied from a baby's chart. Lactation Consultation Note  Patient Name: Shirley Black JIRCV'E Date: 09/07/2021 Reason for consult: Follow-up assessment;Term;Other (Comment) (Eat sleep console, maternal thrush) Age:24 hours  Lactation check-in. Mom continues to pump routinely, last pump expressed 4mL transitional milk, gave to baby. She reports changing breast flange size on R breast to 2mm and keeping 13mm on L breast, uses coconut oil w/ pumping and has noticed a difference in how it feels.  We discussed tips/strategies on how to fully empty breasts, encouraged hand expression pre/post pumping, warmth, breast massage/compression as able when pumping. Today mom will begin to transition baby from [facility] to formula supplement, formula to be given as needed in addition to mom's expressed milk. At this time [facility] confirmed that mom is not ready to begin working with baby at the breast, but will continue to keep it in mind. Praised mom dedication to providing breastmilk to her baby. Encouraged continued good hygiene, washing hands, and pump parts/pieces diligently to help prevent the transmission of thrush to baby. Encouraged to call out with questions or for support as needed.  Maternal Data Has patient been taught Hand Expression?: Yes Does the patient have breastfeeding experience prior to this delivery?: Yes  Feeding Mother's Current Feeding Choice: Breast Milk Nipple Type: Dr. Levert Feinstein Preemie  LATCH Score                    Lactation Tools Discussed/Used Tools: Pump Breast pump type: Double-Electric Breast Pump Reason for Pumping: Mom's choice Pumping frequency: q 3 hrs Pumped volume: 33 mL  Interventions Interventions: DEBP;Education;Pace feeding;Hand express  Discharge Pump: Personal  Consult Status Consult Status: PRN    Danford Bad 09/07/2021, 9:30 AM

## 2021-09-07 NOTE — Discharge Summary (Signed)
Physician Obstetric Discharge Summary  Patient Name: Shirley Black DOB: 01-14-1996 MRN: [id number]                            Discharge Summary  Date of Admission: 09/04/2021 Date of Discharge: 09/07/2021 Delivering Provider: Linzie Collin   Admitting Diagnosis: Term pregnancy [Z34.90] at [redacted]w[redacted]d Secondary diagnosis:  Principal Problem:   Term pregnancy H/o Macrosomia Methadone use Obesity Depression/anxiety  Mode of Delivery:       low uterine, transverse     Discharge diagnosis: Same                          Discharge Day SOAP Note:  Subjective:  The patient has no complaints.  She is ambulating well. She is taking PO well. Pain is well controlled with current medications. Patient is urinating without difficulty.   She is passing flatus.    Objective  Vital signs: BP 118/63 (BP Location: Right Arm)   Pulse 80   Temp 98.3 F (36.8 C) (Oral)   Resp 20   Ht 5\' 5"  (1.651 m)   Wt 106.6 kg   LMP 12/02/2020   SpO2 98%   Breastfeeding Unknown   BMI 39.11 kg/m   Physical Exam: Gen: NAD Abdomen:    Dressing intact no erythema Fundus Fundal Tone: Firm  Lochia Amount: Scant     Data Review Labs: Lab Results  Component Value Date   WBC 9.5 09/01/2021   HGB 11.2 (L) 09/01/2021   HCT 34.1 (L) 09/01/2021   MCV 91.7 09/01/2021   PLT 226 09/01/2021   CBC Latest Ref Rng & Units 09/01/2021 06/23/2021 03/14/2021  WBC 4.0 - 10.5 K/uL 9.5 11.5(H) 7.4  Hemoglobin 12.0 - 15.0 g/dL 11.2(L) 11.2 12.2  Hematocrit 36.0 - 46.0 % 34.1(L) 33.0(L) 35.7  Platelets 150 - 400 K/uL 226 204 186   A POS  Edinburgh Score: Edinburgh Postnatal Depression Scale Screening Tool 04/16/2017  I have been able to laugh and see the funny side of things. 0  I have looked forward with enjoyment to things. 0  I have blamed myself unnecessarily when things went wrong. 0  I have been anxious or worried for no good reason. 0  I have felt scared or panicky for no good reason. 0   Things have been getting on top of me. 0  I have been so unhappy that I have had difficulty sleeping. 0  I have felt sad or miserable. 0  I have been so unhappy that I have been crying. 0  The thought of harming myself has occurred to me. 0  Edinburgh Postnatal Depression Scale Total 0    Assessment:  Principal Problem:   Term pregnancy   Doing well.  Normal progress as expected.  Plan:  Discharge to home  Modified rest as directed - may slowly resume normal activities with restrictions  as discussed.  Medications as written.  See below for additional.      Discharge Instructions: Per After Visit Summary. Activity: Advance as tolerated. Pelvic rest for 6 weeks.  Also refer to After Visit Summary.  Wound care discussed. Diet: Regular Medications: Allergies as of 09/07/2021       Reactions   Latex Hives   Adhesive [tape] Rash        Medication List     STOP taking these medications    calcium carbonate 500  MG chewable tablet Commonly known as: TUMS - dosed in mg elemental calcium   ondansetron 4 MG disintegrating tablet Commonly known as: ZOFRAN-ODT       TAKE these medications    albuterol 108 (90 Base) MCG/ACT inhaler Commonly known as: VENTOLIN HFA Inhale 2 puffs into the lungs every 6 (six) hours as needed for wheezing or shortness of breath.   ibuprofen 600 MG tablet Commonly known as: ADVIL Take 1 tablet (600 mg total) by mouth every 6 (six) hours.   methadone 10 MG/ML solution Commonly known as: DOLOPHINE Take 95 mg by mouth daily.   oxyCODONE-acetaminophen 5-325 MG tablet Commonly known as: PERCOCET/ROXICET Take 1-2 tablets by mouth every 4 (four) hours as needed for moderate pain.   prenatal multivitamin Tabs tablet Take 2 tablets by mouth daily at 12 noon.               Discharge Care Instructions  (From admission, onward)           Start     Ordered   09/07/21 0000  No dressing needed       Comments: Keep wound area  clean and dry   09/07/21 1102           Outpatient follow up:   Follow-up Information     Harlin Heys, MD Follow up in 1 week(s).   Specialties: Obstetrics and Gynecology, Radiology Contact information: [place] [address] [phone]                Postpartum contraception: Will discuss at first post-partum visit.  Discharged Condition: good  Discharged to: home and rooming in - eat sleep console  Newborn Data: Disposition:rooming in  Apgars: APGAR (1 MIN): 8   APGAR (5 MINS): 9   APGAR (10 MINS):    Baby Feeding: Breast  Finis Bud, M.D. 09/07/2021 11:03 AM

## 2021-09-07 NOTE — Discharge Instructions (Signed)
Discharge Instructions:   Follow-up Appointment: Call and schedule a follow-up appointment for a visit in 1-week with Dr. Logan Bores at [facility]!   If there are any new medications, they have been ordered and will be available for pickup at the listed pharmacy on your way home from the hospital.   Call office if you have any of the following: headache, visual changes, fever >101.0 F, chills, shortness of breath, breast concerns, excessive vaginal bleeding, incision drainage or problems, leg pain or redness, depression or any other concerns. If you have vaginal discharge with an odor, let your doctor know.   It is normal to bleed for up to 6 weeks. You should not soak through more than 1 pad in 1 hour. If you have a blood clot larger than your fist with continued bleeding, call your doctor.   After a c-section, you should expect a small amount of blood or clear fluid coming from the incision and abdominal cramping/soreness. Inspect your incision site daily. Stand in front of a mirror to look for any redness, incision opening, or discolored/odorness drainage. Take a shower daily and continue good hygiene. Use own towel and washcloth (do not share). Make sure your sheets on your bed are clean. No pets sleeping around your incision site. Dressing will be removed at your postpartum visit. If the dressing does become wet or soiled underneath, it is okay to remove it.   Activity: Do not lift > 10 lbs for 6 weeks (do not lift anything heavier than your baby). No intercourse, tampons, swimming pools, hot tubs, baths (only showers) for 6 weeks.  No driving for 1-2 weeks. Continue prenatal vitamin, especially if breastfeeding. Increase calories and fluids (water) while breastfeeding.   Your milk will come in, in the next couple of days (right now it is colostrum). You may have a slight fever when your milk comes in, but it should go away on its own.  If it does not, and rises above 101 F please call  the doctor. You will also feel achy and your breasts will be firm. They will also start to leak. If you are breastfeeding, continue as you have been and you can pump/express milk for comfort.   If you have too much milk, your breasts can become engorged, which could lead to mastitis. This is an infection of the milk ducts. It can be very painful and you will need to notify your doctor to obtain a prescription for antibiotics. You can also treat it with a shower or hot/cold compress.   For concerns about your baby, please call your pediatrician.  For breastfeeding concerns, the lactation consultant can be reached at [phone].   Postpartum blues (feelings of happy one minute and sad another minute) are normal for the first few weeks but if it gets worse let your doctor know.   Congratulations! We enjoyed caring for you and your new bundle of joy!

## 2021-09-08 ENCOUNTER — Ambulatory Visit: Payer: Self-pay

## 2021-09-08 NOTE — Lactation Note (Signed)
This note was copied from a baby's chart. Lactation Consultation Note  Patient Name: Shirley Black GYJEH'U Date: 09/08/2021 Reason for consult: Follow-up assessment;1st time breastfeeding Age:24 days Lactation Rounds: ESC LC to the room for a visit. Mother is holding baby in the room. Mother states feeds are going well with bottles. LC reviewed and encouraged feeding on demand and with cues. Baby was crying in the room while Mother was holding him, and stated he can't be hungry, fussy with paci. Baby had fed 45 ml's 1.5 hrs ago. LC encouraged her to feed a little more and swaddle baby. He then settled down. LC encouraged baby wearing in safe position to help baby remain calm.  Reviewed diaper counts for days of life and when to call Peds with questions. Reviewed "understanding Postpartum and Newborn care " booklet at bedside. Reviewed outpatient Lactation number and resources. Encouraged to pump (Spectra) 8x's/24 hours and for baby to feed a minimum of 8x's/24 hours.  Mother stated understanding with all teaching.   Maternal Data Has patient been taught Hand Expression?: Yes Does the patient have breastfeeding experience prior to this delivery?: Yes  Feeding Mother's Current Feeding Choice: Breast Milk and Formula Nipple Type: Slow - flow  Interventions Interventions: Breast feeding basics reviewed;Hand express;DEBP;Education  Discharge Discharge Education: Engorgement and breast care;Warning signs for feeding baby Pump: Personal (spectra pump)  Consult Status Consult Status: PRN Follow-up type: Call as needed    Shirley Black 09/08/2021, 11:25 AM

## 2021-09-11 ENCOUNTER — Encounter: Payer: Self-pay | Admitting: Obstetrics and Gynecology

## 2021-09-11 ENCOUNTER — Telehealth: Payer: Self-pay

## 2021-09-11 ENCOUNTER — Ambulatory Visit (INDEPENDENT_AMBULATORY_CARE_PROVIDER_SITE_OTHER): Payer: Medicaid Other | Admitting: Obstetrics and Gynecology

## 2021-09-11 ENCOUNTER — Other Ambulatory Visit: Payer: Self-pay

## 2021-09-11 VITALS — BP 132/78 | HR 87 | Resp 16 | Ht 65.0 in | Wt 237.8 lb

## 2021-09-11 DIAGNOSIS — Z9889 Other specified postprocedural states: Secondary | ICD-10-CM

## 2021-09-11 NOTE — Telephone Encounter (Signed)
She can come in on Wednesday, Nov 30 at 4:15 for her baby's circumcision.

## 2021-09-11 NOTE — Progress Notes (Signed)
HPI:      [name] is a 25 y.o. [id number] who LMP was No LMP recorded.  Subjective:   She presents today approximately 1 week postop and postpartum.  She recently had concerns regarding her incision and her leg swelling and so she presented to a remote emergency department.  She says they diagnosed her with "heart failure and possibly a retained placenta".  She reports they gave her no medicine for her "heart failure" and she had an ultrasound which showed no evidence of retained products.  They also gave her antibiotics for her incision which she has not begun. She reports no problems with her incision.  She is not having any issues with heart failure.  She does have lower extremity edema.  Normal postpartum lochia.  She does occasionally have back pain but has not used all of her narcotics and is using almost exclusively ibuprofen. She reports that the baby is doing well and is breast-feeding. 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    Hx: The following portions of the patient's history were reviewed and updated as appropriate:             She  has a past medical history of Anxiety, Asthma, Depression, GERD (gastroesophageal reflux disease), and Hepatitis C. She does not have any pertinent problems on file. She  has a past surgical history that includes Tonsillectomy; Tonsilectomy, adenoidectomy, bilateral myringotomy and tubes; Cesarean section (N/A, 04/14/2017); and Cesarean section (N/A, 09/04/2021). Her family history includes Asthma in her father; Cancer in her maternal grandmother and paternal grandmother; Depression in her father, mother, and sister; Diabetes in her mother. She  reports that she has been smoking cigarettes. She has been smoking an average of .25 packs per day. She has never used smokeless tobacco. She reports that she does not drink alcohol and does not use drugs. She has a current medication list which includes the following prescription(s):  albuterol, ibuprofen, methadone, and prenatal multivitamin. She is allergic to latex and adhesive [tape].       Review of Systems:  Review of Systems  Constitutional: Denied constitutional symptoms, night sweats, recent illness, fatigue, fever, insomnia and weight loss.  Eyes: Denied eye symptoms, eye pain, photophobia, vision change and visual disturbance.  Ears/Nose/Throat/Neck: Denied ear, nose, throat or neck symptoms, hearing loss, nasal discharge, sinus congestion and sore throat.  Cardiovascular: Denied cardiovascular symptoms, arrhythmia, chest pain/pressure, edema, exercise intolerance, orthopnea and palpitations.  Respiratory: Denied pulmonary symptoms, asthma, pleuritic pain, productive sputum, cough, dyspnea and wheezing.  Gastrointestinal: Denied, gastro-esophageal reflux, melena, nausea and vomiting.  Genitourinary: Denied genitourinary symptoms including symptomatic vaginal discharge, pelvic relaxation issues, and urinary complaints.  Musculoskeletal: Denied musculoskeletal symptoms, stiffness, swelling, muscle weakness and myalgia.  Dermatologic: Denied dermatology symptoms, rash and scar.  Neurologic: Denied neurology symptoms, dizziness, headache, neck pain and syncope.  Psychiatric: Denied psychiatric symptoms, anxiety and depression.  Endocrine: Denied endocrine symptoms including hot flashes and night sweats.   Meds:   Current Outpatient Medications on File Prior to Visit  Medication Sig Dispense Refill   albuterol (VENTOLIN HFA) 108 (90 Base) MCG/ACT inhaler Inhale 2 puffs into the lungs every 6 (six) hours as needed for wheezing or shortness of breath. 1 each 2   ibuprofen (ADVIL) 600 MG tablet Take 1 tablet (600 mg total) by mouth every 6 (six) hours. 30 tablet 0   methadone (DOLOPHINE) 10 MG/ML solution Take 95 mg by mouth daily.     Prenatal Vit-Fe  Fumarate-FA (PRENATAL MULTIVITAMIN) TABS tablet Take 2 tablets by mouth daily at 12 noon.     No current  facility-administered medications on file prior to visit.      Objective:     Vitals:   09/11/21 1114  BP: 132/78  Pulse: 87  Resp: 16   Filed Weights   09/11/21 1114  Weight: 237 lb 12.8 oz (107.9 kg)               Abdomen: Soft.  Non-tender.  No masses.  No HSM.  Incision/s: Intact.  Healing well.  No erythema.  No drainage.             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 Post-operative state     Patient actually doing very well postop.  No problems with incision-no evidence of infection-incision completely intact.  Normal postpartum lower extremity edema.   Plan:            1.  Reassured patient regarding retained placenta and heart failure.  2.  No reason to take antibiotics at this time.  3.  Discussed methadone taper.  Patient anxious to get off of methadone. Orders No orders of the defined types were placed in this encounter.   No orders of the defined types were placed in this encounter.     F/U  Return in about 5 weeks (around 10/16/2021).  Finis Bud, M.D. 09/11/2021 11:39 AM

## 2021-09-11 NOTE — Telephone Encounter (Signed)
Patient is coming in now to see Dr. Logan Bores. She is coming from [place] so it will take about 1 hour.

## 2021-09-11 NOTE — Telephone Encounter (Signed)
Sent message to [place] to call and have patient scheduled or to let her know when she comes in to see Evans today.

## 2021-09-11 NOTE — Telephone Encounter (Signed)
Patient called and said that she is having light bleeding. She has a lot of yellow vaginal discharge. She also complains of yellow drainage at the site of her incision. She was evaluated at ED on Saturday. She told that she had folliculitis and she needs antibiotics to treat this. No antibiotics were given because she is breastfeeding. She denies fever. She was also told that she had postpartum heart failure when she went to ED in [place]. She is also wondering about when can her son get her circumcision. Can she be overbooked or where would ya'll like her to be added to the schedule for this week.

## 2021-09-12 ENCOUNTER — Encounter: Payer: Self-pay | Admitting: Obstetrics and Gynecology

## 2021-09-13 ENCOUNTER — Encounter: Payer: Medicaid Other | Admitting: Obstetrics and Gynecology

## 2021-09-13 ENCOUNTER — Encounter: Payer: Self-pay | Admitting: Obstetrics and Gynecology

## 2021-09-13 ENCOUNTER — Other Ambulatory Visit: Payer: Self-pay

## 2021-09-13 ENCOUNTER — Ambulatory Visit (INDEPENDENT_AMBULATORY_CARE_PROVIDER_SITE_OTHER): Payer: Medicaid Other | Admitting: Obstetrics and Gynecology

## 2021-09-13 VITALS — BP 128/76 | HR 72 | Ht 65.0 in | Wt 237.5 lb

## 2021-09-13 DIAGNOSIS — T8131XD Disruption of external operation (surgical) wound, not elsewhere classified, subsequent encounter: Secondary | ICD-10-CM

## 2021-09-13 DIAGNOSIS — Z98891 History of uterine scar from previous surgery: Secondary | ICD-10-CM

## 2021-09-13 NOTE — Patient Instructions (Signed)
Wound Care, Adult ?Taking care of your wound properly can help to prevent pain, infection, and scarring. It can also help your wound heal more quickly. Follow instructions from your health care provider about how to care for your wound. ?Supplies needed: ?Soap and water. ?Wound cleanser, saline, or germ-free (sterile) water. ?Gauze. ?If needed, a clean bandage (dressing) or other type of wound dressing material to cover or place in the wound. Follow your health care provider's instructions about what dressing supplies to use. ?Cream or topical ointment to apply to the wound, if told by your health care provider. ?How to care for your wound ?Cleaning the wound ?Ask your health care provider how to clean the wound. This may include: ?Using mild soap and water, a wound cleanser, saline, or sterile water. ?Using a clean gauze to pat the wound dry after cleaning it. Do not rub or scrub the wound. ?Dressing care ?Wash your hands with soap and water for at least 20 seconds before and after you change the dressing. If soap and water are not available, use hand sanitizer. ?Change your dressing as told by your health care provider. This may include: ?Cleaning or rinsing out (irrigating) the wound. ?Application of cream or topical ointment, if told by your health care provider. ?Placing a dressing over the wound or in the wound (packing). ?Covering the wound with an outer dressing. ?Leave stitches (sutures), staples, skin glue, or adhesive strips in place. These skin closures may need to stay in place for 2 weeks or longer. If adhesive strip edges start to loosen and curl up, you may trim the loose edges. Do not remove adhesive strips completely unless your health care provider tells you to do that. ?Ask your health care provider when you can leave the wound uncovered. ?Checking for infection ?Check your wound area every day for signs of infection. Check for: ?More redness, swelling, or pain. ?Fluid or blood. ?Warmth. ?Pus or  a bad smell. ? ?Follow these instructions at home ?Medicines ?If you were prescribed an antibiotic medicine, cream, or ointment, take or apply it as told by your health care provider. Do not stop using the antibiotic even if your condition improves. ?If you were prescribed pain medicine, take it 30 minutes before you do any wound care or as told by your health care provider. ?Take over-the-counter and prescription medicines only as told by your health care provider. ?Eating and drinking ?Eat a diet that includes protein, vitamin A, vitamin C, and other nutrient-rich foods to help the wound heal. ?Foods rich in protein include meat, fish, eggs, dairy, beans, and nuts. ?Foods rich in vitamin A include carrots and dark green, leafy vegetables. ?Foods rich in vitamin C include citrus fruits, tomatoes, broccoli, and peppers. ?Drink enough fluid to keep your urine pale yellow. ?General instructions ?Do not take baths, swim, or use a hot tub until your health care provider approves. Ask your health care provider if you may take showers. You may only be allowed to take sponge baths. ?Do not scratch or pick at the wound. Keep it covered as told by your health care provider. ?Return to your normal activities as told by your health care provider. Ask your health care provider what activities are safe for you. ?Protect your wound from the sun when you are outside for the first 6 months, or for as long as told by your health care provider. Cover up the scar area or apply sunscreen that has an SPF of at least 30. ?Do not   use any products that contain nicotine or tobacco. These products include cigarettes, chewing tobacco, and vaping devices, such as e-cigarettes. If you need help quitting, ask your health care provider. ?Keep all follow-up visits. This is important. ?Contact a health care provider if: ?You received a tetanus shot and you have swelling, severe pain, redness, or bleeding at the injection site. ?Your pain is not  controlled with medicine. ?You have any of these signs of infection: ?More redness, swelling, or pain around the wound. ?Fluid or blood coming from the wound. ?Warmth coming from the wound. ?A fever or chills. ?You are nauseous or you vomit. ?You are dizzy. ?You have a new rash or hardness around the wound. ?Get help right away if: ?You have a red streak of skin near the area around your wound. ?Pus or a bad smell coming from the wound. ?Your wound has been closed with staples, sutures, skin glue, or adhesive strips and it begins to open up and separate. ?Your wound is bleeding, and the bleeding does not stop with gentle pressure. ?These symptoms may represent a serious problem that is an emergency. Do not wait to see if the symptoms will go away. Get medical help right away. Call your local emergency services (911 in the [place] Do not drive yourself to the hospital. ?Summary ?Always wash your hands with soap and water for at least 20 seconds before and after changing your dressing. ?Change your dressing as told by your health care provider. ?To help with healing, eat foods that are rich in protein, vitamin A, vitamin C, and other nutrients. ?Check your wound every day for signs of infection. Contact your health care provider if you think that your wound is infected. ?This information is not intended to replace advice given to you by your health care provider. Make sure you discuss any questions you have with your health care provider. ?Document Revised: 02/07/2021 Document Reviewed: 02/07/2021 ?Elsevier Patient Education ? 2022 [employer] ? ?

## 2021-09-13 NOTE — Progress Notes (Signed)
    GYNECOLOGY CLINIC PROGRESS NOTE Subjective:     Shirley Black is a 25 y.o. [phone] female who presents today for wound check.  She is approximately 1.5 weeks status post repeat C-section.  She was seen approximately 2 days ago in the office by Dr. Burna Mortimer follow-up after recent emergency room visit due to concerns for her incision, leg swelling.  Patient notes that yesterday she began wiping her incision and noted something firm as well as some drainage from the incision with an odor.  Also noting some pain at the incision site.  Was concerned about possible infection.   Objective:     BP 128/76 (BP Location: Left Arm, Patient Position: Sitting, Cuff Size: Large)   Pulse 72   Ht 5\' 5"  (1.651 m)   Wt 237 lb 8 oz (107.7 kg)   Breastfeeding Yes Comment: and formula feeding  BMI 39.52 kg/m   Wound:   wound margins with superficial separation at bilateral edges, ~ 1.5 cm in length. Otherwise healing well.  No signs of infection. No drainage.      Assessment:   Wound check. S/p repeat C-section.  Superficial wound separation  Plan:    1. Discussed appropriate home care of this wound.Cares provided were visual inspection,cleansing with peroxide solution, and application of steri-strips to wound edges. Given several ABD pads to take home.  2. Patient instructions were given. 3. Follow up: 5 weeks for postpartum visit with Dr. .    Logan Bores, MD [facility]

## 2021-09-20 DIAGNOSIS — F1120 Opioid dependence, uncomplicated: Secondary | ICD-10-CM

## 2021-09-28 ENCOUNTER — Other Ambulatory Visit: Payer: Self-pay

## 2021-09-28 ENCOUNTER — Encounter: Payer: Self-pay | Admitting: Obstetrics and Gynecology

## 2021-09-28 DIAGNOSIS — B379 Candidiasis, unspecified: Secondary | ICD-10-CM

## 2021-09-28 MED ORDER — FLUCONAZOLE 150 MG PO TABS: 150.0000 mg | ORAL_TABLET | Freq: Once | ORAL | 1 refills | Status: AC

## 2021-10-06 ENCOUNTER — Telehealth: Payer: Self-pay | Admitting: Obstetrics and Gynecology

## 2021-10-06 NOTE — Telephone Encounter (Signed)
Pt states that she has an infected incision- and part of incision is open. Pt states it's oozing infection, pt was unable to be seen at hospital due to having her children with her. Pt asked if cherry or evans could see her sooner. Please advise.

## 2021-10-19 ENCOUNTER — Encounter: Payer: Self-pay | Admitting: Obstetrics and Gynecology

## 2021-10-19 ENCOUNTER — Other Ambulatory Visit: Payer: Self-pay

## 2021-10-19 ENCOUNTER — Ambulatory Visit (INDEPENDENT_AMBULATORY_CARE_PROVIDER_SITE_OTHER): Payer: Medicaid Other | Admitting: Obstetrics and Gynecology

## 2021-10-19 VITALS — BP 117/65 | HR 75 | Ht 65.0 in | Wt 237.0 lb

## 2021-10-19 DIAGNOSIS — Z5189 Encounter for other specified aftercare: Secondary | ICD-10-CM

## 2021-10-19 DIAGNOSIS — Z9889 Other specified postprocedural states: Secondary | ICD-10-CM

## 2021-10-19 NOTE — Progress Notes (Signed)
HPI:      [name] is a 26 y.o. [id number] who LMP was Patient's last menstrual period was 10/16/2021.  Subjective:   She presents today approximately 6 weeks postpartum for check on her postpartum visit.  Patient reports that her wound is healing better.  It is still not completely closed but she is not concerned about it.  She reports she is not having pain.  She is breast-feeding "90% of the time".  She is still using methadone but continues to decrease her methadone levels.  Her goal is to get off methadone before her next pregnancy. We have discussed birth control and she had 2 previous IUDs and she says she would like to have another.    Hx: The following portions of the patient's history were reviewed and updated as appropriate:             She  has a past medical history of Anxiety, Asthma, Depression, GERD (gastroesophageal reflux disease), and Hepatitis C. She does not have any pertinent problems on file. She  has a past surgical history that includes Tonsillectomy; Tonsilectomy, adenoidectomy, bilateral myringotomy and tubes; Cesarean section (N/A, 04/14/2017); and Cesarean section (N/A, 09/04/2021). Her family history includes Asthma in her father; Cancer in her maternal grandmother and paternal grandmother; Depression in her father, mother, and sister; Diabetes in her mother. She  reports that she has been smoking cigarettes. She has been smoking an average of .25 packs per day. She has never used smokeless tobacco. She reports that she does not drink alcohol and does not use drugs. She has a current medication list which includes the following prescription(s): albuterol, fluconazole, methadone, and prenatal multivitamin. She is allergic to latex and adhesive [tape].       Review of Systems:  Review of Systems  Constitutional: Denied constitutional symptoms, night sweats, recent illness, fatigue, fever, insomnia and weight loss.  Eyes: Denied eye symptoms, eye pain,  photophobia, vision change and visual disturbance.  Ears/Nose/Throat/Neck: Denied ear, nose, throat or neck symptoms, hearing loss, nasal discharge, sinus congestion and sore throat.  Cardiovascular: Denied cardiovascular symptoms, arrhythmia, chest pain/pressure, edema, exercise intolerance, orthopnea and palpitations.  Respiratory: Denied pulmonary symptoms, asthma, pleuritic pain, productive sputum, cough, dyspnea and wheezing.  Gastrointestinal: Denied, gastro-esophageal reflux, melena, nausea and vomiting.  Genitourinary: Denied genitourinary symptoms including symptomatic vaginal discharge, pelvic relaxation issues, and urinary complaints.  Musculoskeletal: Denied musculoskeletal symptoms, stiffness, swelling, muscle weakness and myalgia.  Dermatologic: Denied dermatology symptoms, rash and scar.  Neurologic: Denied neurology symptoms, dizziness, headache, neck pain and syncope.  Psychiatric: Denied psychiatric symptoms, anxiety and depression.  Endocrine: Denied endocrine symptoms including hot flashes and night sweats.   Meds:   Current Outpatient Medications on File Prior to Visit  Medication Sig Dispense Refill   albuterol (VENTOLIN HFA) 108 (90 Base) MCG/ACT inhaler Inhale 2 puffs into the lungs every 6 (six) hours as needed for wheezing or shortness of breath. 1 each 2   fluconazole (DIFLUCAN) 100 MG tablet Take 100 mg by mouth daily.     methadone (DOLOPHINE) 10 MG/ML solution Take 95 mg by mouth daily.     Prenatal Vit-Fe Fumarate-FA (PRENATAL MULTIVITAMIN) TABS tablet Take 2 tablets by mouth daily at 12 noon.     No current facility-administered medications on file prior to visit.      Objective:     Vitals:   10/19/21 1423  BP: 117/65  Pulse: 75   Filed Weights   10/19/21 1423  Weight:  237 lb (107.5 kg)               Abdomen: Soft.  Non-tender.  No masses.  No HSM.  Incision/s: Less than 1 cm area of wound separation approximately quarter centimeter depth..   Healing well.  No erythema.  No drainage.             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 Post-operative state     Patient doing well postop.   Plan:            1.  Patient desires birth control/IUD.  2.  Wound care discussed. Orders No orders of the defined types were placed in this encounter.   No orders of the defined types were placed in this encounter.     F/U  No follow-ups on file.  Finis Bud, M.D. 10/19/2021 2:44 PM

## 2021-10-31 ENCOUNTER — Encounter: Payer: Self-pay | Admitting: Obstetrics and Gynecology

## 2022-01-01 LAB — FETAL NONSTRESS TEST

## 2022-07-24 ENCOUNTER — Encounter: Payer: Self-pay | Admitting: Obstetrics and Gynecology

## 2022-07-26 ENCOUNTER — Encounter: Payer: Self-pay | Admitting: Obstetrics and Gynecology

## 2022-11-30 IMAGING — US US OB COMP LESS 14 WK
1 series · 14 of 28 positions shown · non-contrast
Comparison: None.

CLINICAL DATA: Dating and viability

EXAM:
OBSTETRIC <14 WK ULTRASOUND
TECHNIQUE: Transabdominal ultrasound was performed for evaluation of the
gestation as well as the maternal uterus and adnexal regions.

[Series 1: us ob less than 14 weeks with ob transvaginal · 14 of 60 slices shown]
[im 3/60]
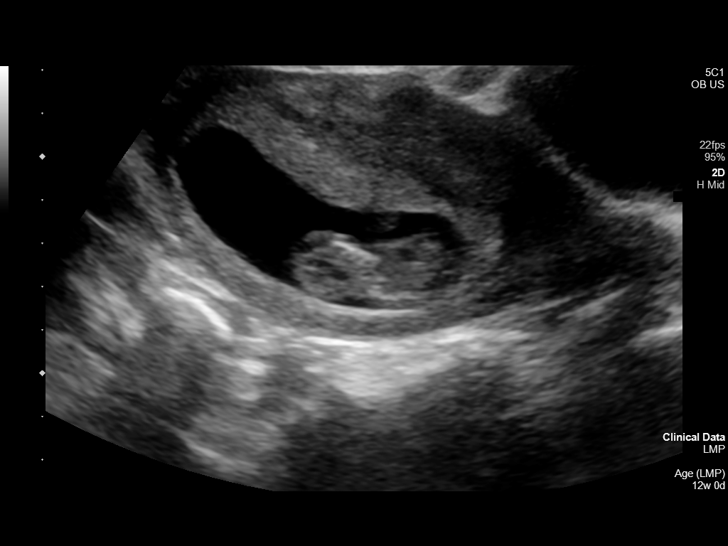
[im 7/60]
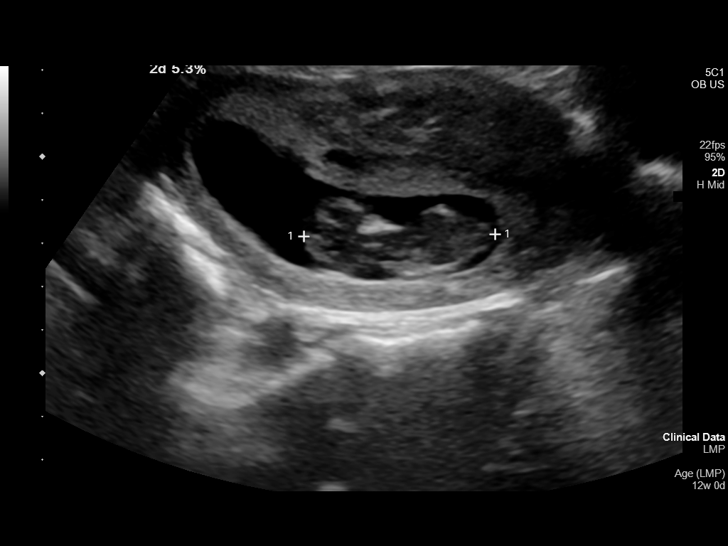
[im 11/60]
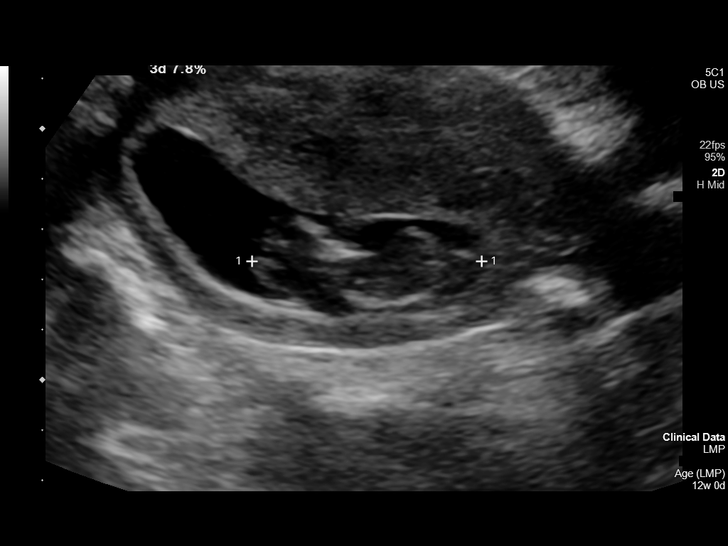
[im 16/60]
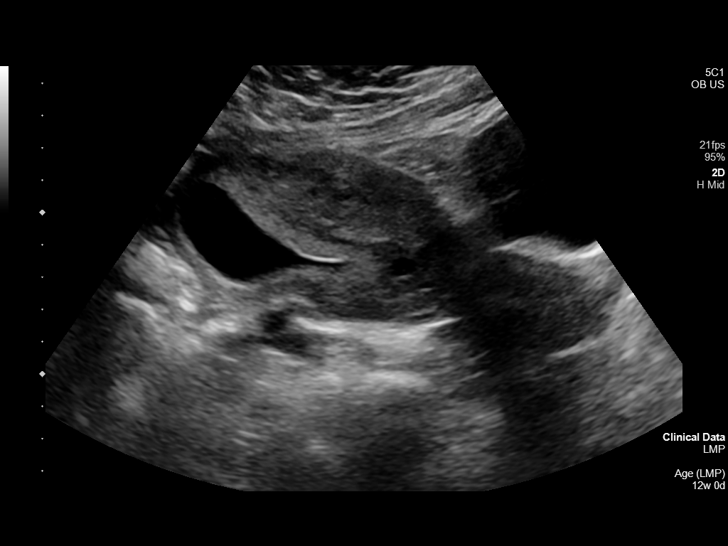
[im 20/60]
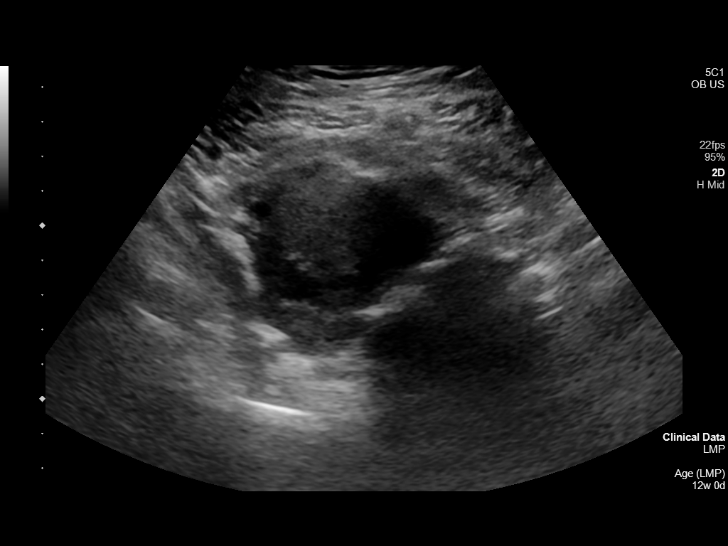
[im 25/60]
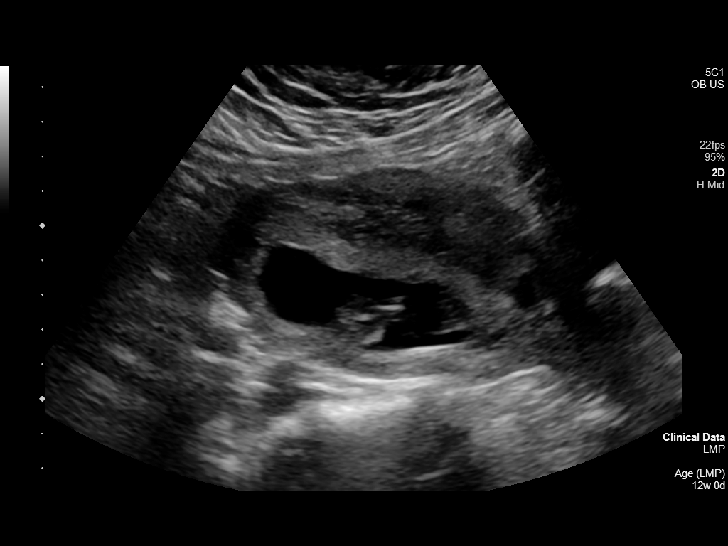
[im 29/60]
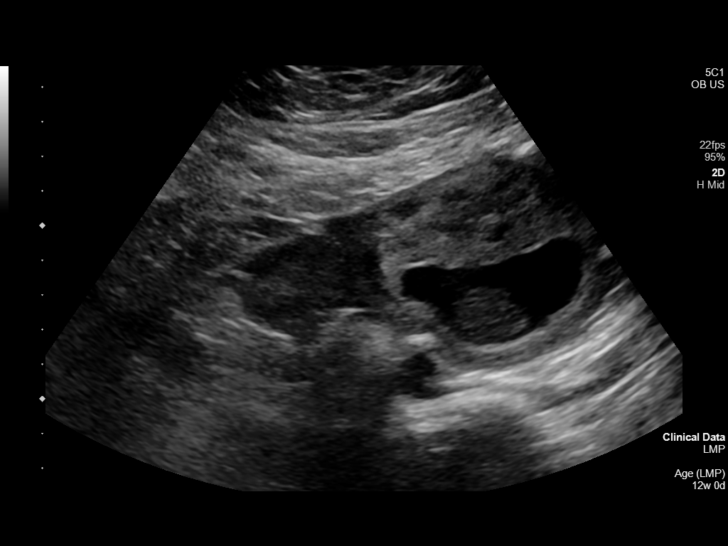
[im 33/60]
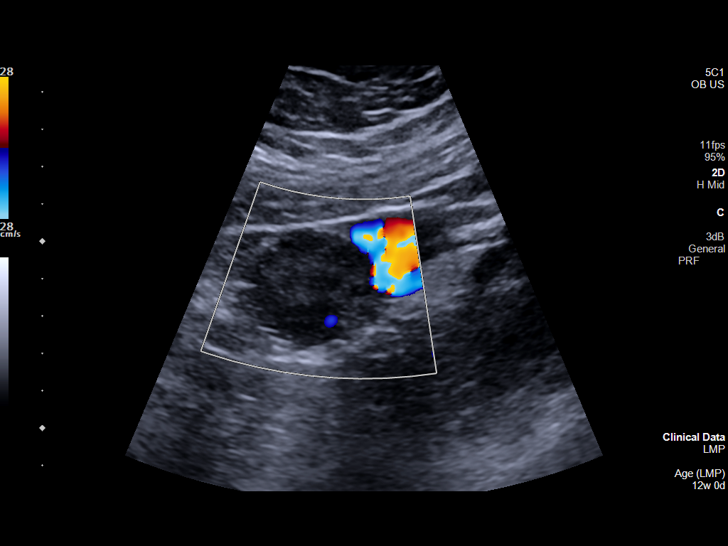
[im 38/60]
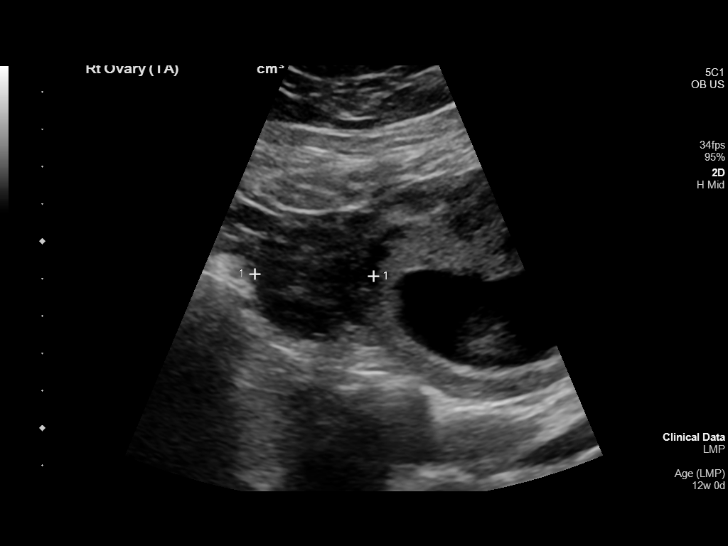
[im 42/60]
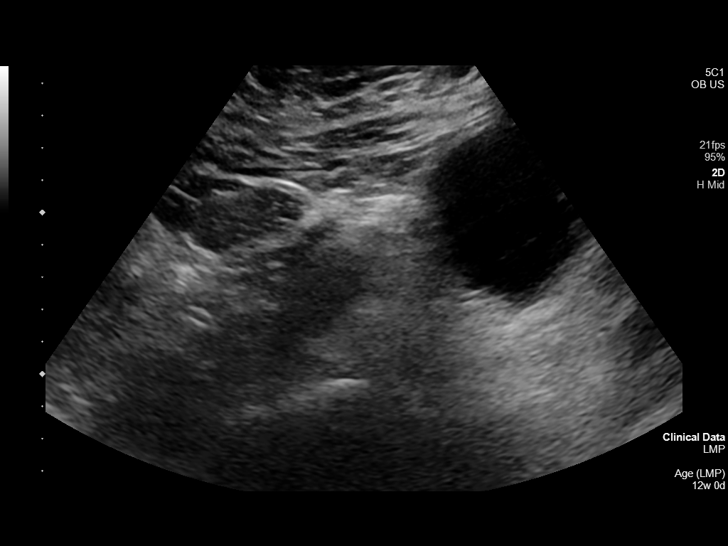
[im 46/60]
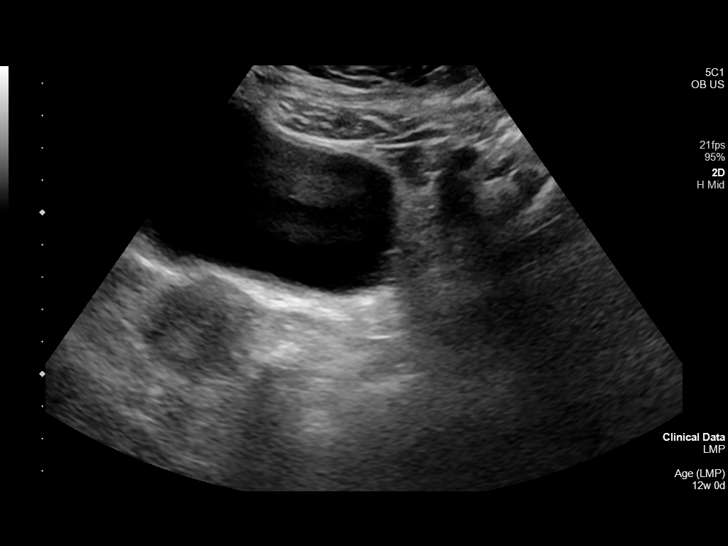
[im 51/60]
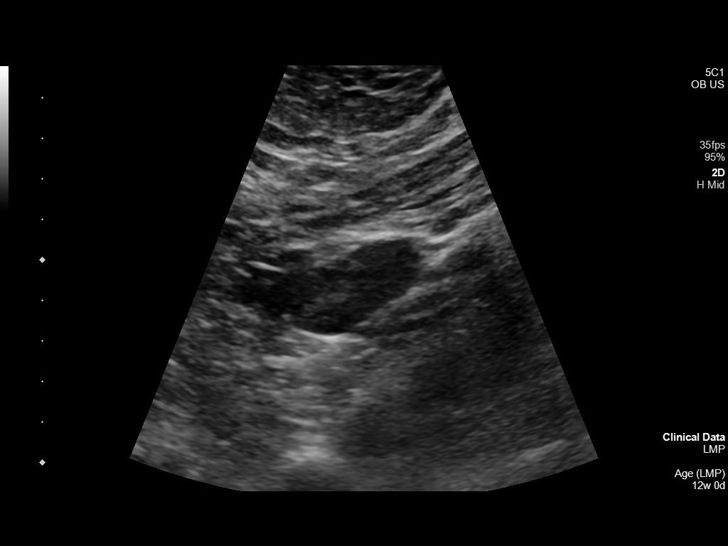
[im 55/60]
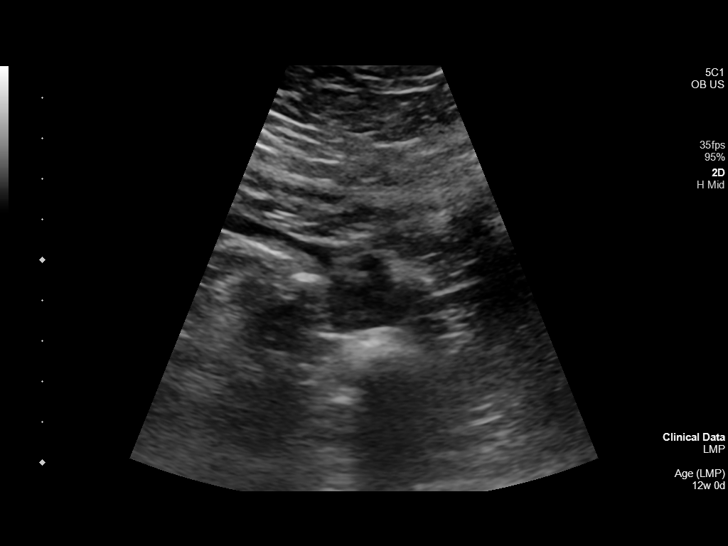
[im 60/60]
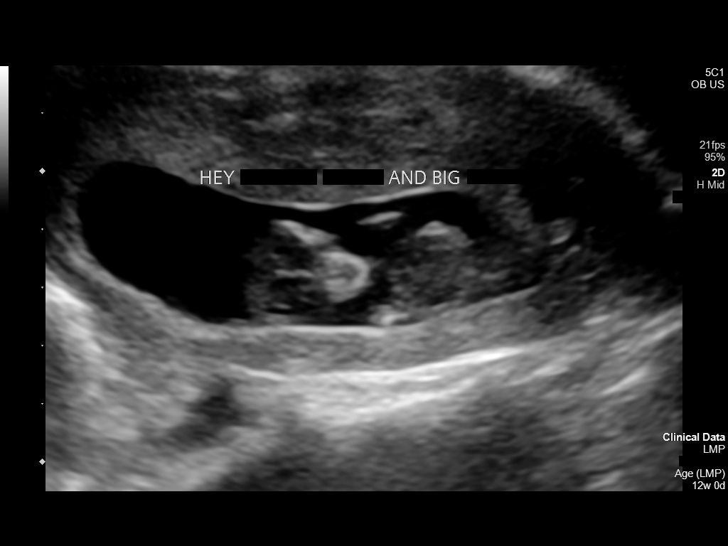

[14 of 28 positions shown; findings below may reference images not displayed]

FINDINGS: Intrauterine gestational sac: Single

Yolk sac:  Not visualized

Embryo:  Visualized

Cardiac Activity: Visualized

Heart Rate: 157 bpm

CRL: 45.7 mm   11 w 3 d                  US EDC: 09/12/2021

Subchorionic hemorrhage:  None visualized.

Maternal uterus/adnexae: Ovaries are within normal limits. Left
ovary measures 2.3 by 3.4 x 1.8 cm. The right ovary measures 3.2 by
4 x 3.1 cm. No free fluid.
IMPRESSION: Single viable intrauterine pregnancy with estimated sonographic age
of 11 weeks 3 days and ultrasound EDC of 09/12/2021.

## 2023-02-19 IMAGING — US US MFM OB DETAIL+14 WK
1 series · 13 of 28 positions shown · non-contrast
Comparison: none

[Series 1: us mfm ob detail+14 wk · 13 of 86 slices shown]
[im 4/86]
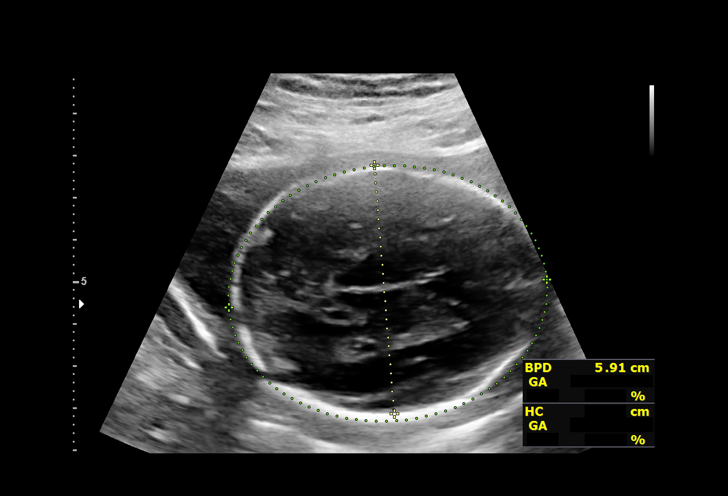
[im 10/86]
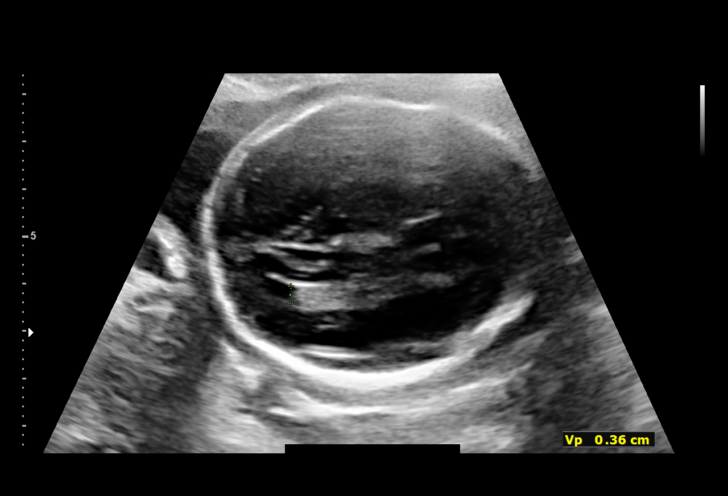
[im 16/86]
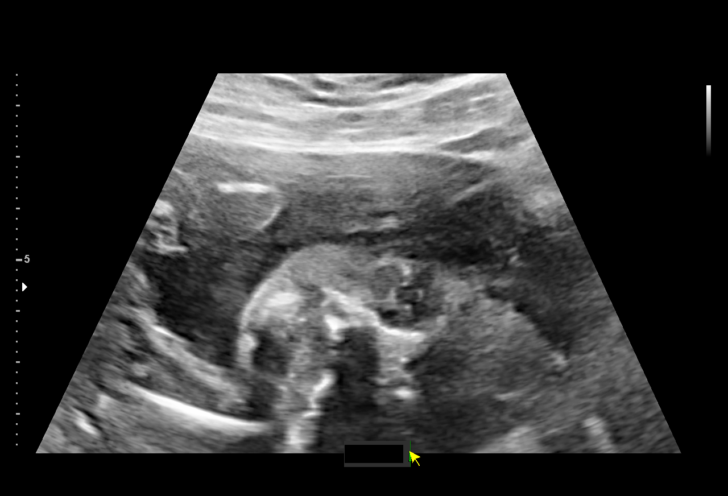
[im 23/86]
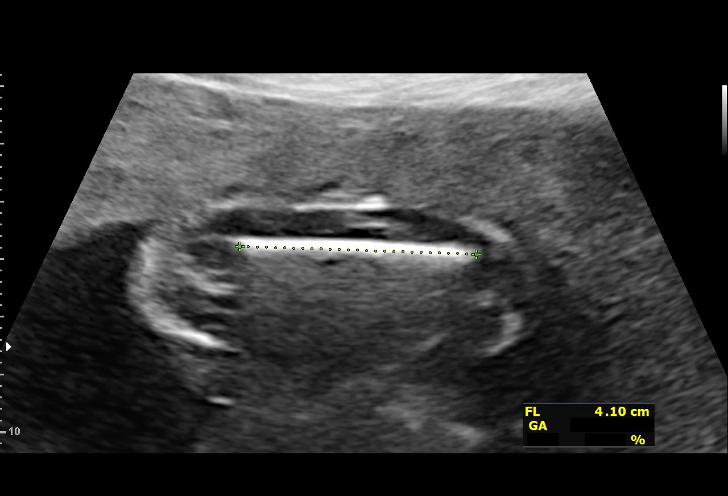
[im 29/86]
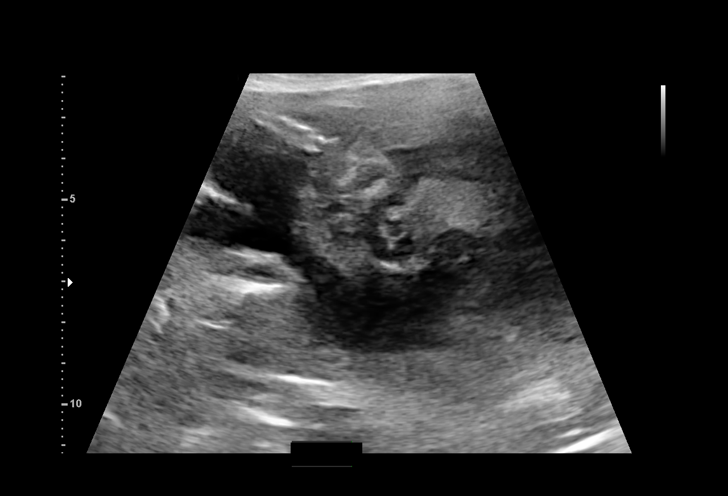
[im 35/86]
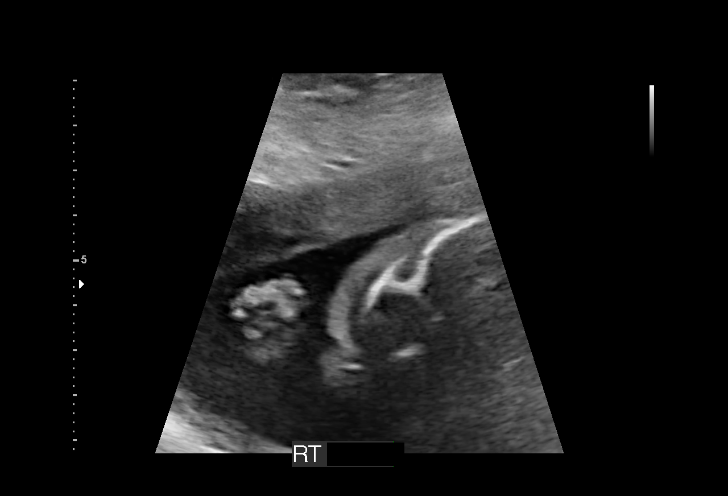
[im 45/86]
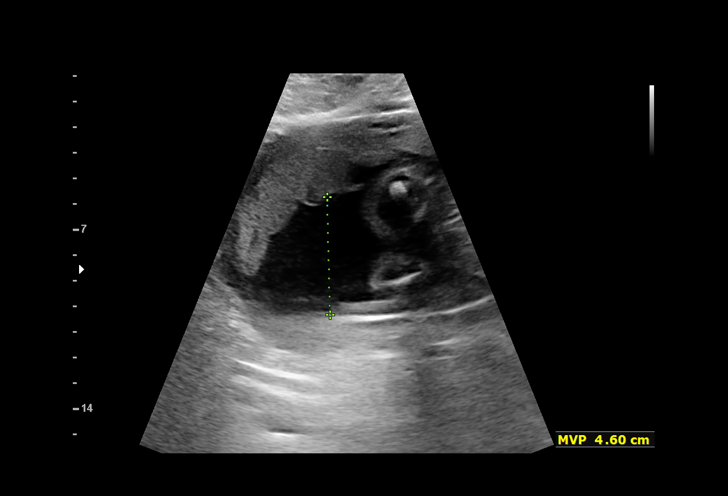
[im 51/86]
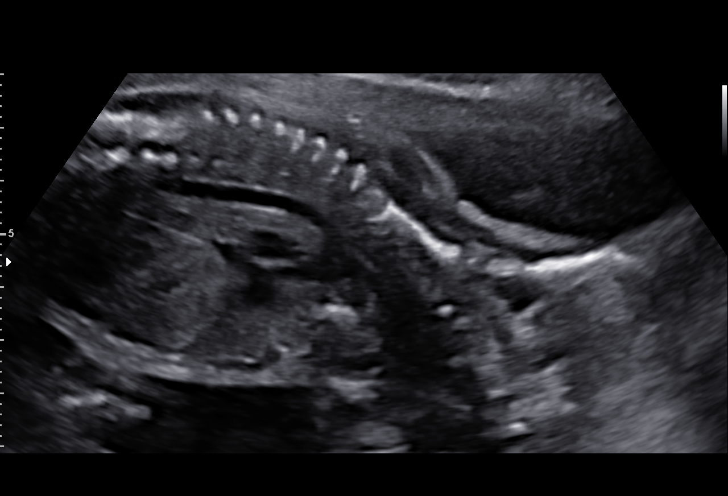
[im 57/86]
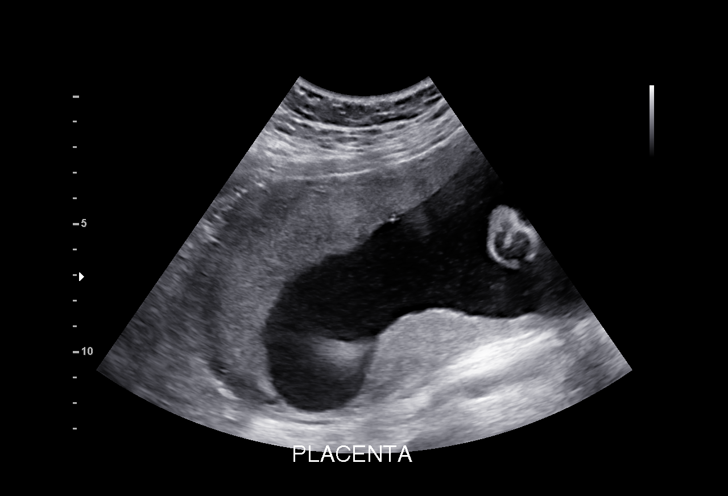
[im 63/86]
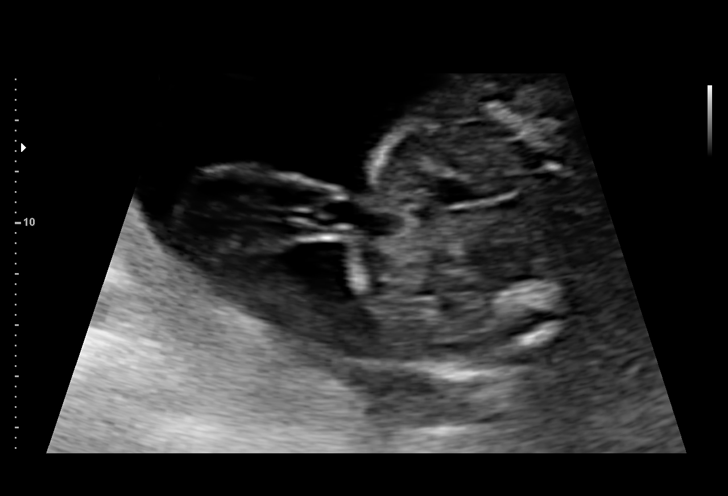
[im 70/86]
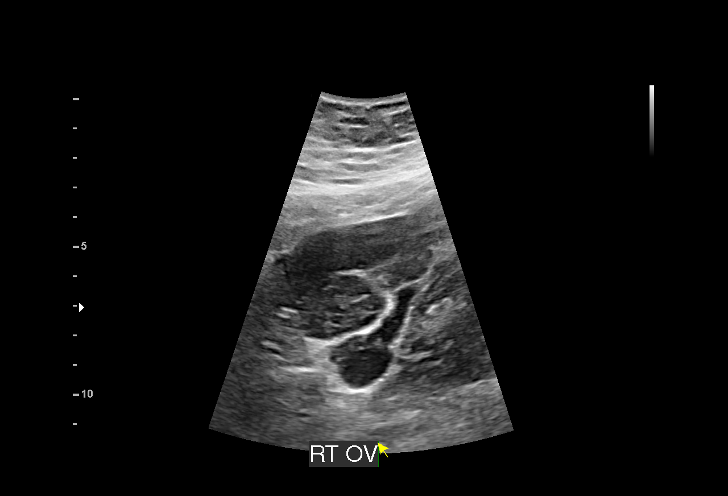
[im 76/86]
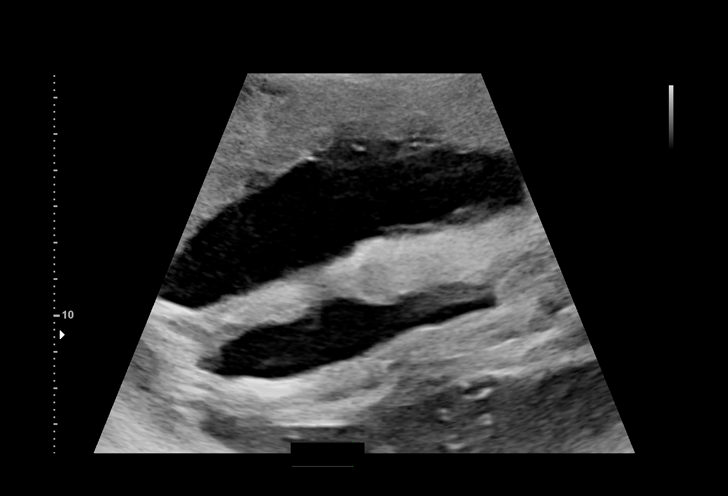
[im 82/86]
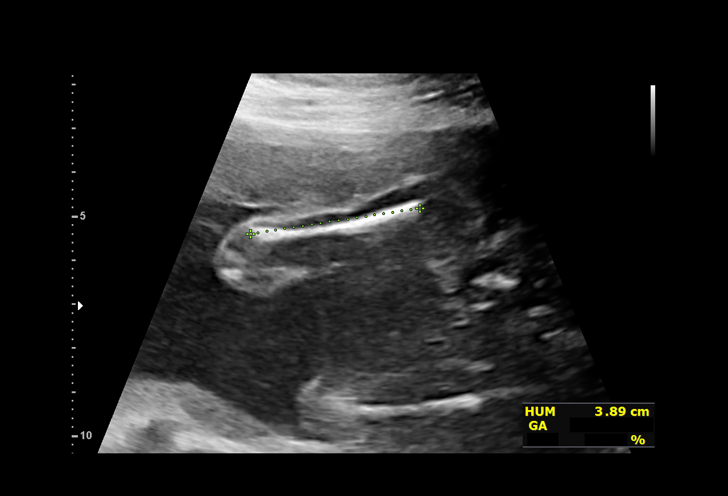

[13 of 28 positions shown; findings below may reference images not displayed]

[REDACTED] Care
                                                            [id number]

                                                      AUJLA

Indications

 Obesity complicating pregnancy, second
 trimester
 Pregnancy comlicated by methadone              O99.320, F11.20,
 maintenance, antepartum
 Chronic Hepatitis C complicating pregnancy,
 antepartum
 History of cesarean delivery, currently
 pregnant
 Poor obstetric history: Previous
 preeclampsia / eclampsia/gestational HTN
 Poor obstetric history: Prior fetal
 macrosomia, antepartum
 23 weeks gestation of pregnancy
 Asthma                                         VGG.0G j61.020
Fetal Evaluation

 Num Of Fetuses:         1
 Fetal Heart Rate(bpm):  138
 Cardiac Activity:       Observed
 Presentation:           Cephalic
 Placenta:               Anterior
 P. Cord Insertion:      Visualized, central
 Amniotic Fluid
 AFI FV:      Within normal limits

                             Largest Pocket(cm)

Biometry

 BPD:      59.1  mm     G. Age:  24w 1d         66  %    CI:        75.06   %    70 - 86
                                                         FL/HC:      18.9   %    18.7 -
 HC:      216.4  mm     G. Age:  23w 5d         38  %    HC/AC:      1.17        1.05 -
 AC:      184.3  mm     G. Age:  23w 2d         31  %    FL/BPD:     69.2   %    71 - 87
 FL:       40.9  mm     G. Age:  23w 2d         28  %    FL/AC:      22.2   %    20 - 24
 HUM:      37.9  mm     G. Age:  23w 2d         37  %
 CER:      25.8  mm     G. Age:  23w 3d         63  %
 LV:        3.8  mm
 CM:        4.8  mm
 Est. FW:     584  gm      1 lb 5 oz     30  %
OB History

 Gravidity:    2         Term:   1
 Living:       1
Gestational Age

 LMP:           23w 4d        Date:  12/02/20                 EDD:   09/08/21
 U/S Today:     23w 4d                                        EDD:   09/08/21
 Best:          23w 4d     Det. By:  LMP  (12/02/20)          EDD:   09/08/21
Anatomy

 Cranium:               Appears normal         Aortic Arch:            Appears normal
 Cavum:                 Appears normal         Ductal Arch:            Appears normal
 Ventricles:            Appears normal         Diaphragm:              Appears normal
 Choroid Plexus:        Appears normal         Stomach:                Appears normal, left
                                                                       sided
 Cerebellum:            Appears normal         Abdomen:                Appears normal
 Posterior Fossa:       Appears normal         Abdominal Wall:         Appears nml (cord
                                                                       insert, abd wall)
 Nuchal Fold:           Appears normal         Cord Vessels:           Appears normal (3
                                                                       vessel cord)
 Face:                  Appears normal         Kidneys:                Appear normal
                        (orbits and profile)
 Lips:                  Appears normal         Bladder:                Appears normal
 Heart:                 Appears normal         Spine:                  Appears normal
                        (4CH, axis, and
                        situs)
 RVOT:                  Not well visualized    Upper Extremities:      Appears normal
 LVOT:                  Not well visualized    Lower Extremities:      Appears normal

 Other:  Fetus appears to be a male.
Targeted Anatomy
 Thorax
 SVC:                   Appears normal         3 V Trachea View:       Appears normal
 3 Vessel View:         Appears normal         IVC:                    Appears normal
Cervix Uterus Adnexa

 Cervix
 Length:           3.29  cm.
 Normal appearance by transabdominal scan.

 Right Ovary
 Within normal limits.

 Left Ovary
 Within normal limits.
Impression

 We performed a fetal anatomical survey.  Amniotic fluid is
 normal and good fetal activity seen.  Fetal biometry is
 consistent with the previously established dates.  No markers
 of aneuploidies or fetal structural defects are seen.  Placenta
 is anterior and there is no evidence of previa or placenta
 accreta spectrum.

 Detailed MFM consultation note in [REDACTED].
Recommendations

 -An appointment was made for her to return in 4 weeks for
 completion of fetal anatomy.
 -Fetal growth assessments every 4 weeks.
 -Consider nicotine replacement therapy (patches) and
 smoking cessation counseling.
                 TIGER
